# Patient Record
Sex: Male | Born: 1935 | Race: White | Hispanic: No
Health system: Southern US, Community
[De-identification: ages and names within clinical notes are randomized; demographics above are authoritative.]

---

## 1997-06-09 ENCOUNTER — Other Ambulatory Visit: Admission: RE | Admit: 1997-06-09 | Discharge: 1997-06-09 | Payer: Self-pay | Admitting: Internal Medicine

## 1997-11-08 ENCOUNTER — Inpatient Hospital Stay (HOSPITAL_COMMUNITY): Admission: EM | Admit: 1997-11-08 | Discharge: 1997-11-08 | Payer: Self-pay | Admitting: Emergency Medicine

## 1999-05-22 ENCOUNTER — Inpatient Hospital Stay (HOSPITAL_COMMUNITY): Admission: AD | Admit: 1999-05-22 | Discharge: 1999-05-23 | Payer: Self-pay | Admitting: Cardiovascular Disease

## 2002-03-17 ENCOUNTER — Emergency Department (HOSPITAL_COMMUNITY): Admission: EM | Admit: 2002-03-17 | Discharge: 2002-03-17 | Payer: Self-pay | Admitting: Emergency Medicine

## 2002-03-17 ENCOUNTER — Encounter: Payer: Self-pay | Admitting: Emergency Medicine

## 2002-11-22 ENCOUNTER — Emergency Department (HOSPITAL_COMMUNITY): Admission: EM | Admit: 2002-11-22 | Discharge: 2002-11-22 | Payer: Self-pay | Admitting: Emergency Medicine

## 2004-02-25 ENCOUNTER — Emergency Department (HOSPITAL_COMMUNITY): Admission: EM | Admit: 2004-02-25 | Discharge: 2004-02-25 | Payer: Self-pay

## 2004-02-28 ENCOUNTER — Inpatient Hospital Stay (HOSPITAL_COMMUNITY): Admission: EM | Admit: 2004-02-28 | Discharge: 2004-03-02 | Payer: Self-pay | Admitting: Emergency Medicine

## 2004-03-11 ENCOUNTER — Ambulatory Visit: Payer: Self-pay | Admitting: Internal Medicine

## 2004-04-23 ENCOUNTER — Ambulatory Visit: Payer: Self-pay | Admitting: Internal Medicine

## 2004-05-01 ENCOUNTER — Ambulatory Visit: Payer: Self-pay | Admitting: Internal Medicine

## 2004-05-20 ENCOUNTER — Ambulatory Visit: Payer: Self-pay | Admitting: Internal Medicine

## 2004-07-09 ENCOUNTER — Ambulatory Visit: Payer: Self-pay | Admitting: Internal Medicine

## 2005-01-06 ENCOUNTER — Ambulatory Visit: Payer: Self-pay | Admitting: Internal Medicine

## 2005-02-19 ENCOUNTER — Emergency Department (HOSPITAL_COMMUNITY): Admission: EM | Admit: 2005-02-19 | Discharge: 2005-02-19 | Payer: Self-pay | Admitting: Emergency Medicine

## 2005-02-21 ENCOUNTER — Ambulatory Visit: Payer: Self-pay | Admitting: Internal Medicine

## 2005-02-25 ENCOUNTER — Ambulatory Visit: Payer: Self-pay | Admitting: *Deleted

## 2005-03-06 ENCOUNTER — Ambulatory Visit (HOSPITAL_COMMUNITY): Admission: RE | Admit: 2005-03-06 | Discharge: 2005-03-06 | Payer: Self-pay | Admitting: Internal Medicine

## 2005-03-24 ENCOUNTER — Ambulatory Visit: Payer: Self-pay | Admitting: Internal Medicine

## 2005-06-23 ENCOUNTER — Ambulatory Visit: Payer: Self-pay | Admitting: Internal Medicine

## 2005-06-24 ENCOUNTER — Ambulatory Visit: Payer: Self-pay | Admitting: Cardiology

## 2005-08-09 ENCOUNTER — Emergency Department (HOSPITAL_COMMUNITY): Admission: EM | Admit: 2005-08-09 | Discharge: 2005-08-09 | Payer: Self-pay | Admitting: Emergency Medicine

## 2005-10-28 ENCOUNTER — Ambulatory Visit: Payer: Self-pay | Admitting: Internal Medicine

## 2005-12-09 ENCOUNTER — Ambulatory Visit: Payer: Self-pay | Admitting: Internal Medicine

## 2005-12-29 ENCOUNTER — Ambulatory Visit: Payer: Self-pay | Admitting: Internal Medicine

## 2006-02-02 ENCOUNTER — Ambulatory Visit: Payer: Self-pay | Admitting: Cardiology

## 2006-02-26 ENCOUNTER — Inpatient Hospital Stay (HOSPITAL_COMMUNITY): Admission: EM | Admit: 2006-02-26 | Discharge: 2006-03-01 | Payer: Self-pay | Admitting: Emergency Medicine

## 2006-03-12 ENCOUNTER — Ambulatory Visit: Payer: Self-pay | Admitting: Internal Medicine

## 2006-03-13 ENCOUNTER — Emergency Department (HOSPITAL_COMMUNITY): Admission: EM | Admit: 2006-03-13 | Discharge: 2006-03-13 | Payer: Self-pay | Admitting: Emergency Medicine

## 2006-03-24 ENCOUNTER — Emergency Department (HOSPITAL_COMMUNITY): Admission: EM | Admit: 2006-03-24 | Discharge: 2006-03-24 | Payer: Self-pay | Admitting: Emergency Medicine

## 2006-03-30 ENCOUNTER — Ambulatory Visit: Payer: Self-pay | Admitting: Internal Medicine

## 2006-05-07 ENCOUNTER — Ambulatory Visit: Payer: Self-pay | Admitting: Internal Medicine

## 2006-08-05 ENCOUNTER — Ambulatory Visit: Payer: Self-pay | Admitting: Internal Medicine

## 2006-08-28 ENCOUNTER — Emergency Department (HOSPITAL_COMMUNITY): Admission: EM | Admit: 2006-08-28 | Discharge: 2006-08-28 | Payer: Self-pay | Admitting: Emergency Medicine

## 2006-12-09 DIAGNOSIS — J4489 Other specified chronic obstructive pulmonary disease: Secondary | ICD-10-CM | POA: Insufficient documentation

## 2006-12-09 DIAGNOSIS — J628 Pneumoconiosis due to other dust containing silica: Secondary | ICD-10-CM | POA: Insufficient documentation

## 2006-12-09 DIAGNOSIS — J449 Chronic obstructive pulmonary disease, unspecified: Secondary | ICD-10-CM

## 2006-12-10 ENCOUNTER — Ambulatory Visit: Payer: Self-pay | Admitting: Internal Medicine

## 2006-12-14 ENCOUNTER — Ambulatory Visit: Payer: Self-pay | Admitting: Cardiology

## 2007-04-09 ENCOUNTER — Ambulatory Visit: Payer: Self-pay | Admitting: Internal Medicine

## 2007-09-01 ENCOUNTER — Encounter: Payer: Self-pay | Admitting: Internal Medicine

## 2007-10-06 ENCOUNTER — Ambulatory Visit: Payer: Self-pay | Admitting: Internal Medicine

## 2007-10-06 LAB — CONVERTED CEMR LAB
Eosinophils Absolute: 0.2 10*3/uL (ref 0.0–0.7)
MCV: 96.2 fL (ref 78.0–100.0)
Monocytes Absolute: 1.6 10*3/uL — ABNORMAL HIGH (ref 0.1–1.0)
Neutrophils Relative %: 61.6 % (ref 43.0–77.0)
Platelets: 429 10*3/uL — ABNORMAL HIGH (ref 150–400)
RBC: 4.52 M/uL (ref 4.22–5.81)
RDW: 11.9 % (ref 11.5–14.6)
WBC: 9.1 10*3/uL (ref 4.5–10.5)

## 2008-02-03 ENCOUNTER — Ambulatory Visit: Payer: Self-pay | Admitting: Internal Medicine

## 2008-05-14 ENCOUNTER — Observation Stay (HOSPITAL_COMMUNITY): Admission: EM | Admit: 2008-05-14 | Discharge: 2008-05-15 | Payer: Self-pay | Admitting: Emergency Medicine

## 2008-08-01 ENCOUNTER — Ambulatory Visit: Payer: Self-pay | Admitting: Internal Medicine

## 2008-08-01 DIAGNOSIS — J9610 Chronic respiratory failure, unspecified whether with hypoxia or hypercapnia: Secondary | ICD-10-CM

## 2008-10-20 ENCOUNTER — Telehealth: Payer: Self-pay | Admitting: Internal Medicine

## 2009-01-29 ENCOUNTER — Ambulatory Visit: Payer: Self-pay | Admitting: Internal Medicine

## 2009-02-22 ENCOUNTER — Telehealth: Payer: Self-pay | Admitting: Internal Medicine

## 2009-02-26 ENCOUNTER — Ambulatory Visit: Payer: Self-pay | Admitting: Internal Medicine

## 2009-03-05 LAB — CONVERTED CEMR LAB
BUN: 18 mg/dL (ref 6–23)
Basophils Relative: 1.2 % (ref 0.0–3.0)
CO2: 30 meq/L (ref 19–32)
Chloride: 102 meq/L (ref 96–112)
Creatinine, Ser: 0.8 mg/dL (ref 0.4–1.5)
Eosinophils Relative: 4.2 % (ref 0.0–5.0)
Glucose, Bld: 95 mg/dL (ref 70–99)
HCT: 41.2 % (ref 39.0–52.0)
Lymphs Abs: 1.2 10*3/uL (ref 0.7–4.0)
MCHC: 34 g/dL (ref 30.0–36.0)
MCV: 97.1 fL (ref 78.0–100.0)
Monocytes Relative: 15 % — ABNORMAL HIGH (ref 3.0–12.0)
Neutro Abs: 3.7 10*3/uL (ref 1.4–7.7)
Neutrophils Relative %: 60.8 % (ref 43.0–77.0)
Platelets: 366 10*3/uL (ref 150.0–400.0)
Sodium: 140 meq/L (ref 135–145)

## 2009-04-05 ENCOUNTER — Ambulatory Visit: Payer: Self-pay | Admitting: Internal Medicine

## 2009-07-31 ENCOUNTER — Ambulatory Visit: Payer: Self-pay | Admitting: Internal Medicine

## 2009-12-07 ENCOUNTER — Telehealth (INDEPENDENT_AMBULATORY_CARE_PROVIDER_SITE_OTHER): Payer: Self-pay | Admitting: *Deleted

## 2009-12-07 ENCOUNTER — Ambulatory Visit: Payer: Self-pay | Admitting: Internal Medicine

## 2010-01-01 ENCOUNTER — Telehealth (INDEPENDENT_AMBULATORY_CARE_PROVIDER_SITE_OTHER): Payer: Self-pay | Admitting: *Deleted

## 2010-01-03 ENCOUNTER — Ambulatory Visit: Payer: Self-pay | Admitting: Internal Medicine

## 2010-01-03 ENCOUNTER — Inpatient Hospital Stay (HOSPITAL_COMMUNITY)
Admission: EM | Admit: 2010-01-03 | Discharge: 2010-01-07 | Payer: Self-pay | Source: Home / Self Care | Admitting: Emergency Medicine

## 2010-01-03 ENCOUNTER — Encounter: Payer: Self-pay | Admitting: Internal Medicine

## 2010-01-03 ENCOUNTER — Ambulatory Visit: Payer: Self-pay | Admitting: Cardiothoracic Surgery

## 2010-01-10 ENCOUNTER — Inpatient Hospital Stay (HOSPITAL_COMMUNITY)
Admission: EM | Admit: 2010-01-10 | Discharge: 2010-02-04 | Payer: Self-pay | Source: Home / Self Care | Attending: Cardiothoracic Surgery | Admitting: Cardiothoracic Surgery

## 2010-01-23 ENCOUNTER — Ambulatory Visit: Payer: Self-pay | Admitting: Physical Medicine & Rehabilitation

## 2010-01-29 ENCOUNTER — Encounter: Payer: Self-pay | Admitting: Internal Medicine

## 2010-02-05 ENCOUNTER — Encounter: Payer: Self-pay | Admitting: Internal Medicine

## 2010-02-06 ENCOUNTER — Ambulatory Visit: Payer: Self-pay | Admitting: Cardiothoracic Surgery

## 2010-02-11 ENCOUNTER — Ambulatory Visit: Payer: Self-pay | Admitting: Cardiothoracic Surgery

## 2010-02-19 ENCOUNTER — Encounter: Payer: Self-pay | Admitting: Internal Medicine

## 2010-02-19 ENCOUNTER — Encounter
Admission: RE | Admit: 2010-02-19 | Discharge: 2010-02-19 | Payer: Self-pay | Source: Home / Self Care | Attending: Cardiothoracic Surgery | Admitting: Cardiothoracic Surgery

## 2010-02-27 ENCOUNTER — Encounter: Payer: Self-pay | Admitting: Internal Medicine

## 2010-02-27 ENCOUNTER — Ambulatory Visit
Admission: RE | Admit: 2010-02-27 | Discharge: 2010-02-27 | Payer: Self-pay | Source: Home / Self Care | Attending: Thoracic Surgery | Admitting: Thoracic Surgery

## 2010-02-27 ENCOUNTER — Ambulatory Visit
Admission: RE | Admit: 2010-02-27 | Discharge: 2010-02-27 | Payer: Self-pay | Source: Home / Self Care | Attending: Internal Medicine | Admitting: Internal Medicine

## 2010-02-27 ENCOUNTER — Encounter
Admission: RE | Admit: 2010-02-27 | Discharge: 2010-02-27 | Payer: Self-pay | Source: Home / Self Care | Attending: Thoracic Surgery | Admitting: Thoracic Surgery

## 2010-02-28 ENCOUNTER — Inpatient Hospital Stay (HOSPITAL_COMMUNITY)
Admission: EM | Admit: 2010-02-28 | Discharge: 2010-03-12 | Disposition: A | Discharge status: Hospice | Payer: Self-pay | Source: Home / Self Care | Attending: Internal Medicine | Admitting: Internal Medicine

## 2010-02-28 LAB — DIFFERENTIAL
Basophils Absolute: 0 10*3/uL (ref 0.0–0.1)
Basophils Relative: 0 % (ref 0–1)
Eosinophils Absolute: 0 10*3/uL (ref 0.0–0.7)
Eosinophils Relative: 0 % (ref 0–5)
Lymphocytes Relative: 5 % — ABNORMAL LOW (ref 12–46)
Lymphs Abs: 0.6 10*3/uL — ABNORMAL LOW (ref 0.7–4.0)
Monocytes Absolute: 0.5 10*3/uL (ref 0.1–1.0)
Monocytes Relative: 4 % (ref 3–12)
Neutro Abs: 11.6 10*3/uL — ABNORMAL HIGH (ref 1.7–7.7)
Neutrophils Relative %: 91 % — ABNORMAL HIGH (ref 43–77)
WBC Morphology: INCREASED

## 2010-02-28 LAB — BASIC METABOLIC PANEL
BUN: 41 mg/dL — ABNORMAL HIGH (ref 6–23)
CO2: 30 mEq/L (ref 19–32)
Calcium: 8.4 mg/dL (ref 8.4–10.5)
Chloride: 100 mEq/L (ref 96–112)
Creatinine, Ser: 0.86 mg/dL (ref 0.4–1.5)
GFR calc Af Amer: >60 mL/min (ref >60)
GFR calc non Af Amer: >60 mL/min (ref >60)
Glucose, Bld: 123 mg/dL — ABNORMAL HIGH (ref 70–99)
Potassium: 4.7 mEq/L (ref 3.5–5.1)
Sodium: 138 mEq/L (ref 135–145)

## 2010-02-28 LAB — POCT CARDIAC MARKERS
CKMB, poc: 1 ng/mL (ref 1.0–8.0)
Myoglobin, poc: 104 ng/mL (ref 12–200)
Troponin i, poc: <0.05 ng/mL (ref 0.00–0.09)

## 2010-02-28 LAB — CBC
HCT: 38.3 % — ABNORMAL LOW (ref 39.0–52.0)
Hemoglobin: 12.1 g/dL — ABNORMAL LOW (ref 13.0–17.0)
MCH: 29.7 pg (ref 26.0–34.0)
MCHC: 31.6 g/dL (ref 30.0–36.0)
MCV: 94.1 fL (ref 78.0–100.0)
Platelets: 368 10*3/uL (ref 150–400)
RBC: 4.07 MIL/uL — ABNORMAL LOW (ref 4.22–5.81)
RDW: 13.6 % (ref 11.5–15.5)
WBC: 12.7 10*3/uL — ABNORMAL HIGH (ref 4.0–10.5)

## 2010-02-28 LAB — URINALYSIS, ROUTINE W REFLEX MICROSCOPIC
Bilirubin Urine: NEGATIVE
Hemoglobin, Urine: NEGATIVE
Ketones, ur: NEGATIVE mg/dL
Nitrite: NEGATIVE
Protein, ur: NEGATIVE mg/dL
Specific Gravity, Urine: 1.022 (ref 1.005–1.030)
Urine Glucose, Fasting: NEGATIVE mg/dL
Urobilinogen, UA: 0.2 mg/dL (ref 0.0–1.0)
pH: 5.5 (ref 5.0–8.0)

## 2010-03-01 ENCOUNTER — Encounter (INDEPENDENT_AMBULATORY_CARE_PROVIDER_SITE_OTHER): Payer: Self-pay | Admitting: Internal Medicine

## 2010-03-01 LAB — CK TOTAL AND CKMB (NOT AT ARMC)
CK, MB: 1.1 ng/mL (ref 0.3–4.0)
Relative Index: INVALID (ref 0.0–2.5)
Total CK: 20 U/L (ref 7–232)

## 2010-03-01 LAB — CARDIAC PANEL(CRET KIN+CKTOT+MB+TROPI)
CK, MB: 1.3 ng/mL (ref 0.3–4.0)
CK, MB: 1.3 ng/mL (ref 0.3–4.0)
Relative Index: INVALID (ref 0.0–2.5)
Relative Index: INVALID (ref 0.0–2.5)
Total CK: 18 U/L (ref 7–232)
Total CK: 25 U/L (ref 7–232)
Troponin I: 0.04 ng/mL (ref 0.00–0.06)
Troponin I: 0.04 ng/mL (ref 0.00–0.06)

## 2010-03-01 LAB — TSH: TSH: 1.263 u[IU]/mL (ref 0.350–4.500)

## 2010-03-01 LAB — CBC
HCT: 34.6 % — ABNORMAL LOW (ref 39.0–52.0)
Hemoglobin: 10.9 g/dL — ABNORMAL LOW (ref 13.0–17.0)
MCH: 29.5 pg (ref 26.0–34.0)
MCHC: 31.5 g/dL (ref 30.0–36.0)
MCV: 93.8 fL (ref 78.0–100.0)
Platelets: 353 10*3/uL (ref 150–400)
RBC: 3.69 MIL/uL — ABNORMAL LOW (ref 4.22–5.81)
RDW: 13.7 % (ref 11.5–15.5)
WBC: 9.1 10*3/uL (ref 4.0–10.5)

## 2010-03-01 LAB — COMPREHENSIVE METABOLIC PANEL
ALT: 21 U/L (ref 0–53)
AST: 26 U/L (ref 0–37)
Albumin: 1.9 g/dL — ABNORMAL LOW (ref 3.5–5.2)
Alkaline Phosphatase: 96 U/L (ref 39–117)
BUN: 43 mg/dL — ABNORMAL HIGH (ref 6–23)
CO2: 32 mEq/L (ref 19–32)
Calcium: 8.2 mg/dL — ABNORMAL LOW (ref 8.4–10.5)
Chloride: 97 mEq/L (ref 96–112)
Creatinine, Ser: 0.97 mg/dL (ref 0.4–1.5)
GFR calc Af Amer: >60 mL/min (ref >60)
GFR calc non Af Amer: >60 mL/min (ref >60)
Glucose, Bld: 172 mg/dL — ABNORMAL HIGH (ref 70–99)
Potassium: 3.9 mEq/L (ref 3.5–5.1)
Sodium: 140 mEq/L (ref 135–145)
Total Bilirubin: 0.4 mg/dL (ref 0.3–1.2)
Total Protein: 4.9 g/dL — ABNORMAL LOW (ref 6.0–8.3)

## 2010-03-01 LAB — MAGNESIUM: Magnesium: 2 mg/dL (ref 1.5–2.5)

## 2010-03-01 LAB — TROPONIN I: Troponin I: 0.05 ng/mL (ref 0.00–0.06)

## 2010-03-01 LAB — D-DIMER, QUANTITATIVE: D-Dimer, Quant: 2.69 ug/mL-FEU — ABNORMAL HIGH (ref 0.00–0.48)

## 2010-03-01 LAB — GLUCOSE, CAPILLARY
Glucose-Capillary: 149 mg/dL — ABNORMAL HIGH (ref 70–99)
Glucose-Capillary: 117 mg/dL — ABNORMAL HIGH (ref 70–99)

## 2010-03-01 LAB — BRAIN NATRIURETIC PEPTIDE: Pro B Natriuretic peptide (BNP): 71 pg/mL (ref 0.0–100.0)

## 2010-03-11 ENCOUNTER — Telehealth (INDEPENDENT_AMBULATORY_CARE_PROVIDER_SITE_OTHER): Payer: Self-pay | Admitting: *Deleted

## 2010-03-11 LAB — GLUCOSE, CAPILLARY
Glucose-Capillary: 143 mg/dL — ABNORMAL HIGH (ref 70–99)
Glucose-Capillary: 154 mg/dL — ABNORMAL HIGH (ref 70–99)
Glucose-Capillary: 178 mg/dL — ABNORMAL HIGH (ref 70–99)
Glucose-Capillary: 145 mg/dL — ABNORMAL HIGH (ref 70–99)
Glucose-Capillary: 114 mg/dL — ABNORMAL HIGH (ref 70–99)
Glucose-Capillary: 144 mg/dL — ABNORMAL HIGH (ref 70–99)
Glucose-Capillary: 156 mg/dL — ABNORMAL HIGH (ref 70–99)
Glucose-Capillary: 160 mg/dL — ABNORMAL HIGH (ref 70–99)
Glucose-Capillary: 167 mg/dL — ABNORMAL HIGH (ref 70–99)
Glucose-Capillary: 274 mg/dL — ABNORMAL HIGH (ref 70–99)
Glucose-Capillary: 86 mg/dL (ref 70–99)
Glucose-Capillary: 91 mg/dL (ref 70–99)
Glucose-Capillary: 101 mg/dL — ABNORMAL HIGH (ref 70–99)
Glucose-Capillary: 102 mg/dL — ABNORMAL HIGH (ref 70–99)
Glucose-Capillary: 109 mg/dL — ABNORMAL HIGH (ref 70–99)
Glucose-Capillary: 134 mg/dL — ABNORMAL HIGH (ref 70–99)
Glucose-Capillary: 135 mg/dL — ABNORMAL HIGH (ref 70–99)
Glucose-Capillary: 183 mg/dL — ABNORMAL HIGH (ref 70–99)

## 2010-03-11 LAB — PROTIME-INR
INR: 1.09 (ref 0.00–1.49)
INR: 1.13 (ref 0.00–1.49)
INR: 1.3 (ref 0.00–1.49)
INR: 2 — ABNORMAL HIGH (ref 0.00–1.49)
INR: 2.26 — ABNORMAL HIGH (ref 0.00–1.49)
INR: 2.01 — ABNORMAL HIGH (ref 0.00–1.49)
INR: 2.3 — ABNORMAL HIGH (ref 0.00–1.49)
INR: 2.93 — ABNORMAL HIGH (ref 0.00–1.49)
INR: 3.09 — ABNORMAL HIGH (ref 0.00–1.49)
Prothrombin Time: 14.3 seconds (ref 11.6–15.2)
Prothrombin Time: 14.7 seconds (ref 11.6–15.2)
Prothrombin Time: 16.4 seconds — ABNORMAL HIGH (ref 11.6–15.2)
Prothrombin Time: 22.8 seconds — ABNORMAL HIGH (ref 11.6–15.2)
Prothrombin Time: 25.1 seconds — ABNORMAL HIGH (ref 11.6–15.2)
Prothrombin Time: 22.9 seconds — ABNORMAL HIGH (ref 11.6–15.2)
Prothrombin Time: 25.4 seconds — ABNORMAL HIGH (ref 11.6–15.2)
Prothrombin Time: 30.6 seconds — ABNORMAL HIGH (ref 11.6–15.2)
Prothrombin Time: 31.9 seconds — ABNORMAL HIGH (ref 11.6–15.2)

## 2010-03-11 LAB — COMPREHENSIVE METABOLIC PANEL
ALT: 35 U/L (ref 0–53)
ALT: 36 U/L (ref 0–53)
AST: 22 U/L (ref 0–37)
AST: 24 U/L (ref 0–37)
Albumin: 1.9 g/dL — ABNORMAL LOW (ref 3.5–5.2)
Albumin: 2 g/dL — ABNORMAL LOW (ref 3.5–5.2)
Alkaline Phosphatase: 130 U/L — ABNORMAL HIGH (ref 39–117)
Alkaline Phosphatase: 150 U/L — ABNORMAL HIGH (ref 39–117)
BUN: 18 mg/dL (ref 6–23)
BUN: 27 mg/dL — ABNORMAL HIGH (ref 6–23)
CO2: 41 mEq/L (ref 19–32)
CO2: 42 mEq/L (ref 19–32)
Calcium: 8.2 mg/dL — ABNORMAL LOW (ref 8.4–10.5)
Calcium: 8.6 mg/dL (ref 8.4–10.5)
Chloride: 87 mEq/L — ABNORMAL LOW (ref 96–112)
Chloride: 89 mEq/L — ABNORMAL LOW (ref 96–112)
Creatinine, Ser: 0.59 mg/dL (ref 0.4–1.5)
Creatinine, Ser: 0.68 mg/dL (ref 0.4–1.5)
GFR calc Af Amer: >60 mL/min (ref >60)
GFR calc Af Amer: >60 mL/min (ref >60)
GFR calc non Af Amer: >60 mL/min (ref >60)
GFR calc non Af Amer: >60 mL/min (ref >60)
Glucose, Bld: 91 mg/dL (ref 70–99)
Glucose, Bld: 100 mg/dL — ABNORMAL HIGH (ref 70–99)
Potassium: 2.8 mEq/L — ABNORMAL LOW (ref 3.5–5.1)
Potassium: 4.6 mEq/L (ref 3.5–5.1)
Sodium: 137 mEq/L (ref 135–145)
Sodium: 139 mEq/L (ref 135–145)
Total Bilirubin: 0.6 mg/dL (ref 0.3–1.2)
Total Bilirubin: 0.5 mg/dL (ref 0.3–1.2)
Total Protein: 4.8 g/dL — ABNORMAL LOW (ref 6.0–8.3)
Total Protein: 5.2 g/dL — ABNORMAL LOW (ref 6.0–8.3)

## 2010-03-11 LAB — BASIC METABOLIC PANEL
BUN: 38 mg/dL — ABNORMAL HIGH (ref 6–23)
BUN: 21 mg/dL (ref 6–23)
CO2: 32 mEq/L (ref 19–32)
CO2: 42 mEq/L (ref 19–32)
Calcium: 8 mg/dL — ABNORMAL LOW (ref 8.4–10.5)
Calcium: 8.2 mg/dL — ABNORMAL LOW (ref 8.4–10.5)
Chloride: 95 mEq/L — ABNORMAL LOW (ref 96–112)
Chloride: 94 mEq/L — ABNORMAL LOW (ref 96–112)
Creatinine, Ser: 0.83 mg/dL (ref 0.4–1.5)
Creatinine, Ser: 0.64 mg/dL (ref 0.4–1.5)
GFR calc Af Amer: >60 mL/min (ref >60)
GFR calc Af Amer: >60 mL/min (ref >60)
GFR calc non Af Amer: >60 mL/min (ref >60)
GFR calc non Af Amer: >60 mL/min (ref >60)
Glucose, Bld: 153 mg/dL — ABNORMAL HIGH (ref 70–99)
Glucose, Bld: 82 mg/dL (ref 70–99)
Potassium: 3.5 mEq/L (ref 3.5–5.1)
Potassium: 3.5 mEq/L (ref 3.5–5.1)
Sodium: 137 mEq/L (ref 135–145)
Sodium: 142 mEq/L (ref 135–145)

## 2010-03-11 LAB — CBC
HCT: 34.4 % — ABNORMAL LOW (ref 39.0–52.0)
HCT: 34.4 % — ABNORMAL LOW (ref 39.0–52.0)
HCT: 36.4 % — ABNORMAL LOW (ref 39.0–52.0)
HCT: 39.2 % (ref 39.0–52.0)
Hemoglobin: 11.1 g/dL — ABNORMAL LOW (ref 13.0–17.0)
Hemoglobin: 11 g/dL — ABNORMAL LOW (ref 13.0–17.0)
Hemoglobin: 11.7 g/dL — ABNORMAL LOW (ref 13.0–17.0)
Hemoglobin: 12.3 g/dL — ABNORMAL LOW (ref 13.0–17.0)
MCH: 30 pg (ref 26.0–34.0)
MCH: 30 pg (ref 26.0–34.0)
MCH: 29.8 pg (ref 26.0–34.0)
MCH: 29.4 pg (ref 26.0–34.0)
MCHC: 32.3 g/dL (ref 30.0–36.0)
MCHC: 32 g/dL (ref 30.0–36.0)
MCHC: 32.1 g/dL (ref 30.0–36.0)
MCHC: 31.4 g/dL (ref 30.0–36.0)
MCV: 93 fL (ref 78.0–100.0)
MCV: 93.7 fL (ref 78.0–100.0)
MCV: 92.6 fL (ref 78.0–100.0)
MCV: 93.8 fL (ref 78.0–100.0)
Platelets: 355 10*3/uL (ref 150–400)
Platelets: 360 10*3/uL (ref 150–400)
Platelets: 403 10*3/uL — ABNORMAL HIGH (ref 150–400)
Platelets: 423 10*3/uL — ABNORMAL HIGH (ref 150–400)
RBC: 3.7 MIL/uL — ABNORMAL LOW (ref 4.22–5.81)
RBC: 3.67 MIL/uL — ABNORMAL LOW (ref 4.22–5.81)
RBC: 3.93 MIL/uL — ABNORMAL LOW (ref 4.22–5.81)
RBC: 4.18 MIL/uL — ABNORMAL LOW (ref 4.22–5.81)
RDW: 13.5 % (ref 11.5–15.5)
RDW: 13.2 % (ref 11.5–15.5)
RDW: 13.2 % (ref 11.5–15.5)
RDW: 13.3 % (ref 11.5–15.5)
WBC: 10.1 10*3/uL (ref 4.0–10.5)
WBC: 11.6 10*3/uL — ABNORMAL HIGH (ref 4.0–10.5)
WBC: 11.5 10*3/uL — ABNORMAL HIGH (ref 4.0–10.5)
WBC: 10.8 10*3/uL — ABNORMAL HIGH (ref 4.0–10.5)

## 2010-03-11 LAB — DIFFERENTIAL
Basophils Absolute: 0 10*3/uL (ref 0.0–0.1)
Basophils Absolute: 0 10*3/uL (ref 0.0–0.1)
Basophils Relative: 0 % (ref 0–1)
Basophils Relative: 0 % (ref 0–1)
Eosinophils Absolute: 0 10*3/uL (ref 0.0–0.7)
Eosinophils Absolute: 0 10*3/uL (ref 0.0–0.7)
Eosinophils Relative: 0 % (ref 0–5)
Eosinophils Relative: 0 % (ref 0–5)
Lymphocytes Relative: 7 % — ABNORMAL LOW (ref 12–46)
Lymphocytes Relative: 11 % — ABNORMAL LOW (ref 12–46)
Lymphs Abs: 0.8 10*3/uL (ref 0.7–4.0)
Lymphs Abs: 1.1 10*3/uL (ref 0.7–4.0)
Monocytes Absolute: 1.2 10*3/uL — ABNORMAL HIGH (ref 0.1–1.0)
Monocytes Absolute: 0.9 10*3/uL (ref 0.1–1.0)
Monocytes Relative: 10 % (ref 3–12)
Monocytes Relative: 9 % (ref 3–12)
Neutro Abs: 9.6 10*3/uL — ABNORMAL HIGH (ref 1.7–7.7)
Neutro Abs: 8.7 10*3/uL — ABNORMAL HIGH (ref 1.7–7.7)
Neutrophils Relative %: 83 % — ABNORMAL HIGH (ref 43–77)
Neutrophils Relative %: 81 % — ABNORMAL HIGH (ref 43–77)

## 2010-03-11 LAB — CULTURE, BLOOD (ROUTINE X 2)
Culture  Setup Time: 201201060609
Culture  Setup Time: 201201060610
Culture: NO GROWTH
Culture: NO GROWTH

## 2010-03-11 LAB — APTT
aPTT: 50 seconds — ABNORMAL HIGH (ref 24–37)
aPTT: 52 seconds — ABNORMAL HIGH (ref 24–37)

## 2010-03-11 LAB — PROTEIN, TOTAL: Total Protein: 5.1 g/dL — ABNORMAL LOW (ref 6.0–8.3)

## 2010-03-11 LAB — MAGNESIUM
Magnesium: 1.8 mg/dL (ref 1.5–2.5)
Magnesium: 2 mg/dL (ref 1.5–2.5)

## 2010-03-11 LAB — VANCOMYCIN, TROUGH: Vancomycin Tr: 7.9 ug/mL — ABNORMAL LOW (ref 10.0–20.0)

## 2010-03-13 LAB — CBC
HCT: 34.2 % — ABNORMAL LOW (ref 39.0–52.0)
Hemoglobin: 10.7 g/dL — ABNORMAL LOW (ref 13.0–17.0)
MCH: 29.9 pg (ref 26.0–34.0)
MCHC: 31.3 g/dL (ref 30.0–36.0)
MCV: 95.5 fL (ref 78.0–100.0)
Platelets: 456 10*3/uL — ABNORMAL HIGH (ref 150–400)
RBC: 3.58 MIL/uL — ABNORMAL LOW (ref 4.22–5.81)
RDW: 13.4 % (ref 11.5–15.5)
WBC: 13.1 10*3/uL — ABNORMAL HIGH (ref 4.0–10.5)

## 2010-03-14 ENCOUNTER — Telehealth: Payer: Self-pay | Admitting: Internal Medicine

## 2010-03-14 ENCOUNTER — Encounter: Payer: Self-pay | Admitting: Internal Medicine

## 2010-03-16 ENCOUNTER — Encounter: Payer: Self-pay | Admitting: Cardiothoracic Surgery

## 2010-03-17 ENCOUNTER — Encounter: Payer: Self-pay | Admitting: Cardiothoracic Surgery

## 2010-03-17 NOTE — Consult Note (Addendum)
NAME:  Cody Shea, Cody Shea NO.:  [id number]  MEDICAL RECORD NO.:  [id number]          PATIENT TYPE:  INP  LOCATION:  2504                         FACILITY:  [facility]  PHYSICIAN:  Clinton D. Maple Hudson, MD, FCCP, FACPDATE OF BIRTH:  1929[birth year]-04  DATE OF CONSULTATION:  03/01/2010 DATE OF DISCHARGE:                                CONSULTATION   PROBLEM FOR CONSULTATION:  An 77 year old man with history of severe pulmonary silicosis, COPD, and recent hospitalization for spontaneous pneumothorax, status post chest tube placement in the left upper lobe of endobronchial valves.  He had seen me and his chest surgeon in the company of his family for office visits 2 days prior to this admission. He became tired during the day, more short of breath, and was admitted upon recognition of a new pneumonia.  Cough is intermittently productive, and as of morning, sputum is thick and green.  He had been using a flutter device for pulmonary toilet at home, but I have encouraged to use it more aggressively.  REVIEW OF SYSTEMS:  He notices some increasing shortness of breath while on chronic oxygen at 2 liters per minute.  He has not appreciated a significant change in cough.  The cough has become more productive otherwise.  Denies chest pain, fever, chills, nausea or vomiting, syncope or palpitation, blood, adenopathy or rash, change in bowel or bladder.  He does have persistent peripheral edema without calf pain.  PAST MEDICAL HISTORY: 1. Former tobacco smoker with COPD. 2. Pulmonary silicosis. 3. Hypertension. 4. History of atrial fibrillation. 5. Degenerative arthritis. 6. History of GI bleed secondary to nonsteroidal antiinflammatory     drugs. 7. 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 8. Spontaneous left pneumothorax, November 2011, status post chest     tube placement requiring endobronchial valve stenting #3, left     upper lobe with ongoing discussion with his  chest surgeons as to     when those could be removed. 9. Cervical spine surgery. 10.Cervical spondylosis. 11.Macular degeneration in the right eye.  FAMILY HISTORY:  Parents are deceased.  Several family with heart disease and diabetes.  SOCIAL HISTORY:  Widowed.  No alcohol.  He had smoked for about 18 years, stopping at 39.  VACCINATION STATUS:  He has been up to date with pneumococcal vaccine and had flu vaccine appropriately this winter.  OBJECTIVE:  GENERAL APPEARANCE:  He is alert, recognized me easily, fully oriented, pleasant, and conversational.  He is a slender elderly man having visibly lost weight over the past year. VITAL SIGNS:  Pulse feels regular and looks like sinus rhythm on monitor.  Oxygen saturation 90% on 2 liters nasal prongs, BP 89/44, respirations about 18 by my count. SKIN:  Clear. ADENOPATHY:  None found at the neck, supraclavicular, or axillary areas. NEUROLOGIC:  Grossly intact.  Fully oriented.  Tongue protrudes midline. Speech is clear.  He is able to move all extremities easily. Generalized weakness. HEENT:  He moves his right eye normally and pupil is reactive.  Ears and nose are clear.  Oropharynx is clear. NECK:  There is no stridor or neck  vein distention.  Trachea is midline. I do not hear carotid bruits.  Thyroid is not enlarged. CHEST:  Bilateral mild rhonchi and crackle with poor air flow.  He seems to talk in full sentences without laboring.  Rhonchi may be slightly pronounced in the right base, but otherwise there is no lateralization. HEART:  Heart rhythm seems regular at this moment.  I do not hear murmur, gallop or rub.  ABDOMEN:  Soft, scaphoid, and nontender. EXTREMITIES:  There is 2-3+ pitting edema in both feet.  Negative Homans with no cyanosis or clubbing.  Peripheral pulses seem intact.  RADIOLOGY:  I have reviewed the images myself and agree with recognition of a new right lower lobe pneumonia.  There are three  endobronchial stents evident in the left upper lobe.  IMPRESSION: 1. It is appropriate to manage this pneumonia as hospital acquired,     and I agree with choice of vancomycin, Zosyn, and Cipro pending     availability of cultures.  He is chronically weak and debilitated. 2. I support and recognize his choice of do not resuscitate, no code     blue, no intubation. 3. Treatment of chronic obstructive pulmonary disease with Solu-     Medrol, Xopenex, and Pulmicort is appropriate. 4. He has a very supportive and capable family making plans for him     now to move closer to them in [place] at the middle of this     month and that is a realistic discharge plan.  I had encouraged     them to help get him established initially with a primary     physician, who could then steer them to an appropriate     pulmonologist in the area.  He may need to be brought back to this     area for thoracic surgical followup of his endobronchial stents.     Clinton D. Maple Hudson, MD, [facility], FACP     CDY/MEDQ  D:  03/01/2010  T:  03/01/2010  Job:  [id number]  cc:   Eduard Clos, MD Lucky Cowboy, M.D.  Electronically Signed by Jetty Duhamel MD FCCP FACP on 03/17/2010 06:54:18 PM

## 2010-03-27 DEATH — deceased

## 2010-03-28 NOTE — Progress Notes (Signed)
Summary: need phusician to  write orders for [facility]  Phone Note From Other Clinic   Caller: harriett with [facility] care management Call For: Dr. Maple Hudson Summary of Call: Harriette with Care Management at [facility] called. Mr. Radin family wants him in [place] for his last days, or weeks. She has spoken to Hospice states that he will need a physician to write the orders but the physicians will not accept him unless he is seen and he completely bed ridden and can not go to the doctor. The family stated that Dr. Maple Hudson had told them if there was anything that he could do for the family to let him know. The family wants to know if he would be willing to give oreder for [facility] once he is relocated. Harriett has spoken to Hospice in [facility] and they will be willing to accept the orders from Dr. Maple Hudson. She can be reached at [phone] or page [phone] Initial call taken by: Vedia Coffer,  March 11, 2010 1:12 PM  Follow-up for Phone Call        Spoke with Harriett.  She states that the pt has days or weeks to live- his nephew is paying 2500 $ to have him moved to [place] so that he can die there with his family.  She states that pt needs order from CDY for hospice care through [facility].  They have checked with other doctors and nobody is willing to do this for the pt.  Pls advise if you are willing to be te attending for Hospice in [facility], thanks Follow-up by: Vernie Murders,  March 11, 2010 3:06 PM  Additional Follow-up for Phone Call Additional follow up Details #1::        Per CDY- Yes, until he gets a doc there.  Spoke with Harriett and notified.  Additional Follow-up by: Vernie Murders,  March 11, 2010 3:30 PM

## 2010-03-28 NOTE — Assessment & Plan Note (Signed)
Summary: post hosptial and rov from 01-28-10/kcw   Primary Provider/Referring Provider:  Oneta Rack  CC:  Hospital follow up, pt c/o increase sob, loose prod cough  light yellow, and pt will be moving to [place] to live with family needs referral.  History of Present Illness: February 26, 2009-  He called [place] complaining of increased weakness and easier dyspnea gradually over months, but especialy in past 5- days. Cough thick yellow. No blood, fever, nodes, chest pain, leg pain or ankle edema.  April 05, 2009-Silicosis, COPD, Chronic respiratory failure Maintenance prednisone has helped breathing. He still tires easily, but with prednisone and his maintenance oxygen he can clear phlegm more easily- white- and he can wash and dry dishes without having to stop to rest. Denies palpitation or syncope, chest pain or foot swelling.  July 31, 2009- Silicosis, COPD, Chronic respiratory failure Has continued maintenance prednisone 5 mg daily. It doesn't help his dyspnea with limited exertion, but it definitely helps his appetite. Cough is comfortably productive of clear phlegm with "MucusRelief". Denies wheeze, blood, chest pain or ankle edema. Stays on oxygen 2 L even with exertion.   February 27, 2010- Silicosis, COPD, Chronic respiratory failure Nurse-CC: Hospital follow up, pt c/o increase sob, loose prod cough  light yellow, pt will be moving to [place] to live with family needs referral He was hosp for spontaneous left  PTX with chest tube, endobronchial valve x 3, instillation of talc. . They are going to Dr Edwyna Shell to f/u this morning.  He was slow recovering from anesthesia. Exertion of Physical Therapy at Rehab facility is too exhausting. Cough productive of white  to yellow, nonbloody sputum.. Not much pain at chest tube site, no angina or palpitation. . Feet swell.  He is about to move to [place] with family and we discussed that move and getting re-established.    Preventive  Screening-Counseling & Management  Alcohol-Tobacco     Smoking Status: quit     Packs/Day: 0.5     Year Quit: 1991  Current Medications (verified): 1)  Advair Diskus 250-50 Mcg/dose  Misc (Fluticasone-Salmeterol) .Marland Kitchen.. 1 Puff Bid 2)  Gemfibrozil 600 Mg  Tabs (Gemfibrozil) .... As Directed 3)  Proair Hfa 108 (90 Base) Mcg/act Aers (Albuterol Sulfate) .... 2 Puffs Four Times A Day As Needed 4)  Oxygen 2 L/m [employer] Continuous 5)  Verapamil Hcl Cr 180 Mg Cr-Tabs (Verapamil Hcl) .Marland Kitchen.. 1 Once Daily 6)  Vitamin D3 1000 Unit Caps (Cholecalciferol) .... Take 2 By Mouth Once Daily 7)  Zantac 150 Mg Tabs (Ranitidine Hcl) .... Take 1 By Mouth Once Daily 8)  Prednisone 5 Mg Tabs (Prednisone) .Marland Kitchen.. 1-2 Tabs Daily As Needed 9)  Mucus Relief 400 Mg Tabs (Guaifenesin) .... Take 2 By Mouth Two Times A Day 10)  Magnesium 250 Mg Tabs (Magnesium) .... Take 2 By Mouth Once Daily 11)  Lanoxin 0.125 Mg Tabs (Digoxin) .Marland Kitchen.. 1 Once Daily 12)  Ranitidine Hcl 150 Mg Caps (Ranitidine Hcl) .Marland Kitchen.. 1 Once Daily 13)  Spiriva Handihaler 18 Mcg Caps (Tiotropium Bromide Monohydrate) .Marland Kitchen.. 1 Once Daily 14)  Hhn .... Three Times A Day  Allergies (verified): 1)  ! Aspirin 2)  ! Codeine 3)  Doxycycline  Past History:  Past Surgical History: Last updated: 08/01/2008 traumatic loss left eye Cervical spine surgery- 3-6  Family History: Last updated: 2007/10/21 Mother- deceased age 75 Father-deceased age 31 sibling 1- deceased age 25; heart Sibling 2 - deceased age 44;heart Sibling 3-deceased age 72; heart  Sibling 4- living age 40; DM Sibling 13- living age 38; DM  Social History: Last updated: 10/14/2007 Patient states former smoker. Quit in 1991 SMoked for 18 years widowed; no children Exercies 5 times a week no etoh  Risk Factors: Smoking Status: quit (02/27/2010) Packs/Day: 0.5 (02/27/2010)  Past Medical History: Silicosis COPD  Hx Pneumonia Cervical spondylosis Macular degeneration right eye /  prosthesis left eye (traumatic loss) hx NSAID induced GI bleed  Social History: Smoking Status:  quit Packs/Day:  0.5  Review of Systems      See HPI       The patient complains of shortness of breath with activity, productive cough, weight change, and hand/feet swelling.  The patient denies shortness of breath at rest, non-productive cough, coughing up blood, chest pain, irregular heartbeats, acid heartburn, indigestion, loss of appetite, abdominal pain, difficulty swallowing, sore throat, tooth/dental problems, headaches, nasal congestion/difficulty breathing through nose, sneezing, itching, ear ache, anxiety, depression, rash, change in color of mucus, and fever.    Vital Signs:  Patient profile:   77 year old male Height:      68 inches Weight:      99.8 pounds BMI:     15.23 O2 Sat:      92 % on 2 L/min Pulse rate:   70 / minute BP sitting:   140 / 78  (left arm)  Vitals Entered By: Renold Genta RCP, LPN (February 27, 2010 11:29 AM)  O2 Flow:  2 L/min CC: Hospital follow up, pt c/o increase sob, loose prod cough  light yellow, pt will be moving to [place] to live with family needs referral Comments Medications reviewed with patient Renold Genta RCP, LPN  February 27, 2010 11:37 AM    Physical Exam  Additional Exam:  General: A/Ox3; pleasant and cooperative, NAD, thin, supplemental oxygen- 92% sat on 2L/M SKIN: no rash, lesions NODES: no lymphadenopathy HEENT: [place]/AT, EOM- WNL, Conjuctivae- clear, PERRLA, TM-WNL, Nose- clear, Throat- clear and wnl, raspy, some whistle at larynx, no stridor NECK: Supple w/ fair ROM, JVD- none, normal carotid impulses w/o bruits Thyroid-  CHEST: Bilateral wheeze and rhonchi., poor airflow, talkative HEART: RRR, no m/g/r heard ABDOMEN: Soft scaphoid, nontender YQM:VHQI, nl pulses, 2-3 + edema, no clubbing  NEURO: Grossly intact to observation      Impression & Recommendations:  Problem # 1:  CHRONIC RESPIRATORY FAILURE  (ICD-518.83)  Significant chronic respiratory limitation, with peripheral edema likely due to cor pulmonale and a residual bronchitis with secretions. I don't think another antibiotic would help now. He has a Flutter and is encouraged to start using it for pulmonary toilet. We discussed establishement first with a primary doctor in [place].  He is chronically oxygen dependent and looking very frail since his last hosp.   Problem # 2:  SILICOSIS (ICD-502) Severe fibrosis with no specific treatment available.   Medications Added to Medication List This Visit: 1)  Verapamil Hcl Cr 180 Mg Cr-tabs (Verapamil hcl) .Marland Kitchen.. 1 once daily 2)  Lanoxin 0.125 Mg Tabs (Digoxin) .Marland Kitchen.. 1 once daily 3)  Ranitidine Hcl 150 Mg Caps (Ranitidine hcl) .Marland Kitchen.. 1 once daily 4)  Spiriva Handihaler 18 Mcg Caps (Tiotropium bromide monohydrate) .Marland Kitchen.. 1 once daily 5)  Hhn  .... Three times a day  Other Orders: Est. Patient Level IV (69629)  Patient Instructions: 1)  I wish you well with your move. Please call as needed.  2)  You can run your oxygen 2-3 L/M  3)  use your  Flutter, blowing 3-4 times, 3 times daily to loosen the mucus in your chest.  4)  cc Dr Oneta Rack

## 2010-03-28 NOTE — Assessment & Plan Note (Signed)
Summary: discuss fatigue/lwa   Primary Provider/Referring Provider:  Oneta Rack  CC:  Accute Visit-Increased fatigue x 5-6 days; SOB and cough-thick and yellow in color..  History of Present Illness: 08/01/08- Silicosis, COPD Using Guaifenesin 6 daily and it increases ability to clear secretions. Nebulizer machine worn out and not using it. Lost weight eating regularly, now regaining a little.- attributed to work of breathing. "I can't slow down". Dr Oneta Rack watching closely. Denies chest pain, palpitation. Coughs little and scant clear thick mucus. No pedal edema, fevers, sweats or enlarged glands. Using continuous oxygen since October. ER visit for dyspnea in March with cough- reesponded to sequential nebs. He uses rescue inhaler 0-6 / day.  January 29, 2009-Silicosis, COPD Lugging around his large portable oxygen tank. Continues slow gradual weight loss. Eats well and denies bleeding. Dr Oneta Rack attributes it to work of breathing. Lives alone. Now treated for macular degeneration in his one eye. Breathing isn't dramatically changed, but slowly less exercise tolerance. Cough unchanged, productive white mucus several times daily. Had flu vax. Has had Pneumovax at least twice. Denies chest pain. Chronic intermittent palpitation, no edema.  February 26, 2009-  He called [place] complaining of increased weakness and easier dyspnea gradually over months, but especialy in past 5- days. Cough thick yellow. No blood, fever, nodes, chest pain, leg pain or ankle edema.   Current Medications (verified): 1)  Advair Diskus 250-50 Mcg/dose  Misc (Fluticasone-Salmeterol) .Marland Kitchen.. 1 Puff Bid 2)  Gemfibrozil 600 Mg  Tabs (Gemfibrozil) .... As Directed 3)  Proair Hfa 108 (90 Base) Mcg/act Aers (Albuterol Sulfate) .... 2 Puffs Four Times A Day As Needed 4)  Oxygen 2 L/m [facility] Continuous 5)  Verapamil Hcl Cr 120 Mg Cr-Tabs (Verapamil Hcl) .... Take 1 By Mouth Two Times A Day 6)  Vitamin D3 1000 Unit Caps  (Cholecalciferol) .... Take 1 By Mouth Once Daily 7)  Zantac 150 Mg Tabs (Ranitidine Hcl) .... Take 1 By Mouth Once Daily  Allergies (verified): 1)  ! Aspirin 2)  ! Codeine 3)  Doxycycline  Past History:  Past Medical History: Last updated: 04/09/2007 Silicosis COPD Pneumonia Cervical spondylosis  Past Surgical History: Last updated: 08/01/2008 traumatic loss left eye Cervical spine surgery- 3-6  Family History: Last updated: 2007-10-16 Mother- deceased age 52 Father-deceased age 88 sibling 1- deceased age 66; heart Sibling 2 - deceased age 1;heart Sibling 3-deceased age 48; heart Sibling 31- living age 73; DM Sibling 40- living age 92; DM  Social History: Last updated: 10/14/2007 Patient states former smoker. Quit in 1991 SMoked for 18 years widowed; no children Exercies 5 times a week no etoh  Risk Factors: Smoking Status: quit (04/09/2007)  Review of Systems      See HPI       The patient complains of weight loss and dyspnea on exertion.  The patient denies anorexia, fever, weight gain, vision loss, decreased hearing, hoarseness, chest pain, syncope, peripheral edema, prolonged cough, headaches, hemoptysis, abdominal pain, and severe indigestion/heartburn.    Vital Signs:  Patient profile:   77 year old male Height:      68 inches Weight:      103.13 pounds BMI:     15.74 O2 Sat:      98 % on 1.5 L/min Pulse rate:   98 / minute BP sitting:   122 / 68  (left arm) Cuff size:   regular  Vitals Entered By: Reynaldo Minium CMA (February 26, 2009 10:41 AM)  O2 Flow:  1.5  L/min  Physical Exam  Additional Exam:  General: A/Ox3; pleasant and cooperative, NAD, thin, supplemental oxygen- hands too cold to register for oximetry SKIN: no rash, lesions NODES: no lymphadenopathy HEENT: /AT, EOM- WNL, Conjuctivae- clear, PERRLA, TM-WNL, Nose- clear, Throat- clear and wnl NECK: Supple w/ fair ROM, JVD- none, normal carotid impulses w/o bruits Thyroid-  CHEST:  Distant but no crackles, rales, wheezes or dullness., talkative HEART: RRR, no m/g/r heard ABDOMEN: Soft scaphoid, nontender ZOX:WRUE, nl pulses, no edema, no clubbing  NEURO: Grossly intact to observation      Impression & Recommendations:  Problem # 1:  SILICOSIS (ICD-502) He has been declining, but I''m not sure if there is more than expected from his silicosis. Remains on his oxygen. We will check for anemia and hypokalemia  Medications Added to Medication List This Visit: 1)  Prednisone 10 Mg Tabs (Prednisone) .... Take 1 by mouth once daily  Other Orders: Est. Patient Level II (45409) TLB-BMP (Basic Metabolic Panel-BMET) (80048-METABOL) TLB-CBC Platelet - w/Differential (85025-CBCD)  Patient Instructions: 1)  Please schedule a follow-up appointment in 1 month. 2)  Script for prednisone 10 mg daily to  3)  Labs Prescriptions: PREDNISONE 10 MG TABS (PREDNISONE) take 1 by mouth once daily  #30 x 1   Entered by:   Reynaldo Minium CMA   Authorized by:   Waymon Budge MD   Signed by:   Reynaldo Minium CMA on 02/26/2009   Method used:   Telephoned to ...       [facility]  [address] # [id number]* (retail)       [address]       [place], [place]  [address]       Ph: [phone] or [phone]       Fax: [phone]   RxID:   [id number]

## 2010-03-28 NOTE — Progress Notes (Signed)
Summary: PRESCRIPT  Phone Note Call from Patient   Caller: Patient Call For: YOUNG Summary of Call: PT NEED REFILL FOR ADVAIR 250/50 SENT TO [facility] Initial call taken by: Rickard Patience,  January 01, 2010 9:53 AM  Follow-up for Phone Call        Pt states that [facility]. [facility] and give refill verbal over the phone, but I could not get to a person, so I have printed rx to have CY sign and I will fax once signed.Carron Curie CMA  January 01, 2010 10:39 AM   Additional Follow-up for Phone Call Additional follow up Details #1::        Pt aware that [facility] at [facility] request.Katie [facility] CMA  January 01, 2010 12:28 PM     Prescriptions: ADVAIR DISKUS 250-50 MCG/DOSE  MISC (FLUTICASONE-SALMETEROL) 1 puff bid  #3 x 1   Entered by:   Carron Curie CMA   Authorized by:   Waymon Budge MD   Signed by:   Carron Curie CMA on 01/01/2010   Method used:   Printed then faxed to ...       [facility]* (mail-order)             , [place]         Ph: [phone]       Fax: [phone]   RxID:   [id number]

## 2010-03-28 NOTE — Assessment & Plan Note (Signed)
Summary: flu shot//jrc  Nurse Visit   Allergies: 1)  ! Aspirin 2)  ! Codeine 3)  Doxycycline  Orders Added: 1)  Flu Vaccine 81yrs + MEDICARE PATIENTS [Q2039] 2)  Administration Flu vaccine - MCR [G0008] Flu Vaccine Consent Questions     Do you have a history of severe allergic reactions to this vaccine? no    Any prior history of allergic reactions to egg and/or gelatin? no    Do you have a sensitivity to the preservative Thimersol? no    Do you have a past history of Guillan-Barre Syndrome? no    Do you currently have an acute febrile illness? no    Have you ever had a severe reaction to latex? no    Vaccine information given and explained to patient? yes    Are you currently pregnant? no    Lot Number:[id number]   Exp Date:08/24/2010   Site Given  Left Deltoid IMmedflu   Tammy Scott  December 10, 2009 9:57 AM

## 2010-03-28 NOTE — Assessment & Plan Note (Signed)
Summary: 6 month follow up/kcw   Primary Provider/Referring Provider:  Oneta Rack  CC:  6 month follow up visit-breathing okay as long as not to hot out.Marland Kitchen  History of Present Illness:  January 29, 2009-Silicosis, COPD Lugging around his large portable oxygen tank. Continues slow gradual weight loss. Eats well and denies bleeding. Dr Oneta Rack attributes it to work of breathing. Lives alone. Now treated for macular degeneration in his one eye. Breathing isn't dramatically changed, but slowly less exercise tolerance. Cough unchanged, productive white mucus several times daily. Had flu vax. Has had Pneumovax at least twice. Denies chest pain. Chronic intermittent palpitation, no edema.  February 26, 2009-  He called [place] complaining of increased weakness and easier dyspnea gradually over months, but especialy in past 5- days. Cough thick yellow. No blood, fever, nodes, chest pain, leg pain or ankle edema.  April 05, 2009-Silicosis, COPD, Chronic respiratory failure Maintenance prednisone has helped breathing. He still tires easily, but with prednisone and his maintenance oxygen he can clear phlegm more easily- white- and he can wash and dry dishes without having to stop to rest. Denies palpitation or syncope, chest pain or foot swelling.  July 31, 2009- Silicosis, COPD, Chronic respiratory failure Has continued maintenance prednisone 5 mg daily. It doesn't help his dyspnea with limited exertion, but it definitely helps his appetite. Cough is comfortably productive of clear phlegm with "MucusRelief". Denies wheeze, blood, chest pain or ankle edema. Stays on oxygen 2 L even with exertion.      Preventive Screening-Counseling & Management  Alcohol-Tobacco     Smoking Status: quit > 6 months     Year Started: 1948     Year Quit: 1991  Current Medications (verified): 1)  Advair Diskus 250-50 Mcg/dose  Misc (Fluticasone-Salmeterol) .Marland Kitchen.. 1 Puff Bid 2)  Gemfibrozil 600 Mg  Tabs (Gemfibrozil) ....  As Directed 3)  Proair Hfa 108 (90 Base) Mcg/act Aers (Albuterol Sulfate) .... 2 Puffs Four Times A Day As Needed 4)  Oxygen 2 L/m [employer] Continuous 5)  Verapamil Hcl Cr 120 Mg Cr-Tabs (Verapamil Hcl) .... Take 1 By Mouth Two Times A Day 6)  Vitamin D3 1000 Unit Caps (Cholecalciferol) .... Take 2 By Mouth Once Daily 7)  Zantac 150 Mg Tabs (Ranitidine Hcl) .... Take 1 By Mouth Once Daily 8)  Prednisone 5 Mg Tabs (Prednisone) .Marland Kitchen.. 1-2 Tabs Daily As Needed 9)  Mucus Relief 400 Mg Tabs (Guaifenesin) .... Take 2 By Mouth Two Times A Day 10)  Magnesium 250 Mg Tabs (Magnesium) .... Take 2 By Mouth Once Daily  Allergies (verified): 1)  ! Aspirin 2)  ! Codeine 3)  Doxycycline  Past History:  Past Surgical History: Last updated: 08/01/2008 traumatic loss left eye Cervical spine surgery- 3-6  Family History: Last updated: 2007/10/23 Mother- deceased age 61 Father-deceased age 100 sibling 1- deceased age 31; heart Sibling 2 - deceased age 22;heart Sibling 3-deceased age 94; heart Sibling 90- living age 7; DM Sibling 35- living age 58; DM  Social History: Last updated: 10/14/2007 Patient states former smoker. Quit in 1991 SMoked for 18 years widowed; no children Exercies 5 times a week no etoh  Risk Factors: Smoking Status: quit > 6 months (07/31/2009)  Past Medical History: Silicosis COPD Pneumonia Cervical spondylosis Macular degeneration right eye / prosthesis left eye  Social History: Smoking Status:  quit > 6 months  Review of Systems      See HPI       The patient complains of  shortness of breath with activity and productive cough.  The patient denies shortness of breath at rest, non-productive cough, coughing up blood, chest pain, irregular heartbeats, acid heartburn, indigestion, loss of appetite, weight change, abdominal pain, difficulty swallowing, sore throat, tooth/dental problems, headaches, nasal congestion/difficulty breathing through nose, sneezing, itching,  hand/feet swelling, rash, and change in color of mucus.    Vital Signs:  Patient profile:   77 year old male Height:      68 inches Weight:      103 pounds BMI:     15.72 O2 Sat:      100 % on 2 L/min Pulse rate:   72 / minute BP sitting:   118 / 70  (left arm) Cuff size:   regular  Vitals Entered By: Reynaldo Minium CMA (July 31, 2009 9:09 AM)  O2 Flow:  2 L/min CC: 6 month follow up visit-breathing okay as long as not to hot out.   Physical Exam  Additional Exam:  General: A/Ox3; pleasant and cooperative, NAD, thin, supplemental oxygen- 100% sat on 2L/M SKIN: no rash, lesions NODES: no lymphadenopathy HEENT: [place]/AT, EOM- WNL, Conjuctivae- clear, PERRLA, TM-WNL, Nose- clear, Throat- clear and wnl, raspy NECK: Supple w/ fair ROM, JVD- none, normal carotid impulses w/o bruits Thyroid-  CHEST: Distant with trace bilateral respiratory wheeze only heard anteriorly., talkative HEART: RRR, no m/g/r heard ABDOMEN: Soft scaphoid, nontender EAV:WUJW, nl pulses, no edema, no clubbing  NEURO: Grossly intact to observation      Impression & Recommendations:  Problem # 1:  CHRONIC RESPIRATORY FAILURE (JXB-147.82)  I reviewed his oxygen status. He prefers to carry multiple tanks rather than deal with limited duration liquid oxygen. I gave permission to increase to 3-4 L/M when active as needed.  Problem # 2:  SILICOSIS (ICD-502)  No progression but we continue to track. He had some problems with Workman's Comp status from Radiology.  Problem # 3:  COPD (ICD-496) He had smoked for a long time. Fibrosis with restrictive component is actually ikely to stabilize his airways against effects of emphysema.  Medications Added to Medication List This Visit: 1)  Vitamin D3 1000 Unit Caps (Cholecalciferol) .... Take 2 by mouth once daily 2)  Mucus Relief 400 Mg Tabs (Guaifenesin) .... Take 2 by mouth two times a day 3)  Magnesium 250 Mg Tabs (Magnesium) .... Take 2 by mouth once  daily  Other Orders: Est. Patient Level IV (95621)  Patient Instructions: 1)  Please schedule a follow-up appointment in 6 months. 2)  OK to run oxygen at 3-4 :L/M when active, turning back down to 2L/M at rest and for sleep 3)  Continue prednisone 5-10 mg daily.

## 2010-03-28 NOTE — Progress Notes (Signed)
Summary: FYI  Phone Note Other Incoming   Caller: sarah--hospice--[phone] Summary of Call: FYI:  Patient passed away this morning at 1:45am. Initial call taken by: Lehman Prom,  03-24-10 8:13 AM  Follow-up for Phone Call        noted by CDY per Katie.Reynaldo Minium CMA  March 24, 2010 9:53 AM

## 2010-03-28 NOTE — Letter (Signed)
Summary: [facility]  [facility]   Imported By: Sherian Rein 03/06/2010 13:02:59  _____________________________________________________________________  External Attachment:    Type:   Image     Comment:   External Document

## 2010-03-28 NOTE — Progress Notes (Signed)
Summary: refill  Phone Note Call from Patient Call back at Home Phone [phone]   Caller: Patient Call For: young Reason for Call: Refill Medication Summary of Call: Need refill on advair 250-54mcg [facility] Initial call taken by: Darletta Moll,  December 07, 2009 9:10 AM  Follow-up for Phone Call        Spoke with pt and advised that [facility]. Follow-up by: Vernie Murders,  December 07, 2009 10:00 AM    Prescriptions: ADVAIR DISKUS 250-50 MCG/DOSE  MISC (FLUTICASONE-SALMETEROL) 1 puff bid  #3 x 1   Entered by:   Vernie Murders   Authorized by:   Waymon Budge MD   Signed by:   Vernie Murders on 12/07/2009   Method used:   Electronically to        [facility] (mail-[facility])             , [place]         Ph: [phone]       Fax: [phone]   RxID:   [id number]

## 2010-03-28 NOTE — Assessment & Plan Note (Signed)
Summary: 1 month/ mbw   Primary Provider/Referring Provider:  Oneta Rack  CC:  1 month follow up visit-breathing better but decrease energy.Cody Shea  History of Present Illness:  January 29, 2009-Silicosis, COPD Lugging around his large portable oxygen tank. Continues slow gradual weight loss. Eats well and denies bleeding. Dr Oneta Rack attributes it to work of breathing. Lives alone. Now treated for macular degeneration in his one eye. Breathing isn't dramatically changed, but slowly less exercise tolerance. Cough unchanged, productive white mucus several times daily. Had flu vax. Has had Pneumovax at least twice. Denies chest pain. Chronic intermittent palpitation, no edema.  February 26, 2009-  He called [place] complaining of increased weakness and easier dyspnea gradually over months, but especialy in past 5- days. Cough thick yellow. No blood, fever, nodes, chest pain, leg pain or ankle edema.  April 05, 2009-Silicosis, COPD, Chronic respiratory failure Maintenance prednisone has helped breathing. He still tires easily, but with prednisone and his maintenance oxygen he can clear phlegm more easily- white- and he can wash and dry dishes without having to stop to rest. Denies palpitation or syncope, chest pain or foot swelling.   Current Medications (verified): 1)  Advair Diskus 250-50 Mcg/dose  Misc (Fluticasone-Salmeterol) .Cody Shea.. 1 Puff Bid 2)  Gemfibrozil 600 Mg  Tabs (Gemfibrozil) .... As Directed 3)  Proair Hfa 108 (90 Base) Mcg/act Aers (Albuterol Sulfate) .... 2 Puffs Four Times A Day As Needed 4)  Oxygen 2 L/m [facility] Continuous 5)  Verapamil Hcl Cr 120 Mg Cr-Tabs (Verapamil Hcl) .... Take 1 By Mouth Two Times A Day 6)  Vitamin D3 1000 Unit Caps (Cholecalciferol) .... Take 1 By Mouth Once Daily 7)  Zantac 150 Mg Tabs (Ranitidine Hcl) .... Take 1 By Mouth Once Daily 8)  Prednisone 10 Mg Tabs (Prednisone) .... Take 1 By Mouth Once Daily  Allergies (verified): 1)  ! Aspirin 2)  ! Codeine 3)   Doxycycline  Past History:  Past Medical History: Last updated: 04/09/2007 Silicosis COPD Pneumonia Cervical spondylosis  Past Surgical History: Last updated: 08/01/2008 traumatic loss left eye Cervical spine surgery- 3-6  Family History: Last updated: 10/26/07 Mother- deceased age 10 Father-deceased age 33 sibling 1- deceased age 27; heart Sibling 2 - deceased age 37;heart Sibling 3-deceased age 66; heart Sibling 38- living age 74; DM Sibling 67- living age 37; DM  Social History: Last updated: 10/14/2007 Patient states former smoker. Quit in 1991 SMoked for 18 years widowed; no children Exercies 5 times a week no etoh  Risk Factors: Smoking Status: quit (04/09/2007)  Review of Systems      See HPI       The patient complains of dyspnea on exertion and prolonged cough.  The patient denies anorexia, fever, weight loss, weight gain, vision loss, decreased hearing, hoarseness, chest pain, syncope, headaches, hemoptysis, abdominal pain, and severe indigestion/heartburn.    Vital Signs:  Patient profile:   77 year old male Height:      68 inches Weight:      106 pounds BMI:     16.18 O2 Sat:      99 % on 2 L/min Pulse rate:   100 / minute BP sitting:   126 / 80  (left arm) Cuff size:   regular  Vitals Entered By: Reynaldo Minium CMA (April 05, 2009 10:37 AM)  O2 Flow:  2 L/min  Physical Exam  Additional Exam:  General: A/Ox3; pleasant and cooperative, NAD, thin, supplemental oxygen- hands too cold to register for oximetry SKIN:  no rash, lesions NODES: no lymphadenopathy HEENT: [place]/AT, EOM- WNL, Conjuctivae- clear, PERRLA, TM-WNL, Nose- clear, Throat- clear and wnl, raspy NECK: Supple w/ fair ROM, JVD- none, normal carotid impulses w/o bruits Thyroid-  CHEST: Distant with trace bilateral espiratory wheeze only heard anteriorly., talkative HEART: Frequent dropped beats, no m/g/r heard ABDOMEN: Soft scaphoid, nontender ZOX:WRUE, nl pulses, no edema, no  clubbing  NEURO: Grossly intact to observation      Impression & Recommendations:  Problem # 1:  CHRONIC RESPIRATORY FAILURE (ICD-518.83)  We will change to 5 mg prednisone tabs for dosing flexibility and let him see if he can get down to 5 mg daily.  Orders: Est. Patient Level III (45409) Prescription Created Electronically [phone])  Problem # 2:  COPD (ICD-496)  He looked at different protable oxygen systems and decided to stay with tanks for greater duration.  Orders: Est. Patient Level III (47829)  Medications Added to Medication List This Visit: 1)  Prednisone 5 Mg Tabs (Prednisone) .Cody Shea.. 1-2 tabs daily as needed  Patient Instructions: 1)  Keep appointmnet in June unless needed earlier 2)  We are changing prednisone pills to 5 mg, so you can try 1-2 tabs per day as needed. Prescriptions: PREDNISONE 5 MG TABS (PREDNISONE) 1-2 tabs daily as needed  #150 x 0   Entered and Authorized by:   Waymon Budge MD   Signed by:   Waymon Budge MD on 04/05/2009   Method used:   Electronically to        [id number] (retail)       [address]       [place], [place]  [address]       Ph: [phone] or [phone]       Fax: [phone]   RxID:   [id number]    Immunization History:  Influenza Immunization History:    Influenza:  historical (12/13/2008)

## 2010-03-28 NOTE — Miscellaneous (Signed)
Summary: [facility]  [facility]   Imported By: Sherian Rein 03/07/2010 08:01:03  _____________________________________________________________________  External Attachment:    Type:   Image     Comment:   External Document

## 2010-03-28 NOTE — Letter (Signed)
Summary: Kerin Perna MD/[place]  Kathlee Nations Trigt MD/[place]   Imported By: Lester Speed 02/01/2010 10:00:31  _____________________________________________________________________  External Attachment:    Type:   Image     Comment:   External Document

## 2010-03-28 NOTE — Letter (Signed)
Summary: [place]  [place]   Imported By: Sherian Rein 02/27/2010 12:19:23  _____________________________________________________________________  External Attachment:    Type:   Image     Comment:   External Document

## 2010-04-03 NOTE — Letter (Signed)
Summary: Death Certificate/[facility]  Death Certificate/[facility]   Imported By: Maryln Gottron 03/25/2010 09:29:36  _____________________________________________________________________  External Attachment:    Type:   Image     Comment:   External Document

## 2010-04-15 NOTE — Consult Note (Signed)
NAME:  Cody Shea, Cody Shea NO.:  [id number]  MEDICAL RECORD NO.:  [id number]          PATIENT TYPE:  INP  LOCATION:  2504                         FACILITY:  [facility]  PHYSICIAN:  Ether Wolters L. Ladona Ridgel, MD  DATE OF BIRTH:  1927-04-30  DATE OF CONSULTATION:  03/08/2010 DATE OF DISCHARGE:                                CONSULTATION   REQUESTING PHYSICIAN:  Mauro Kaufmann, MD  REASON FOR CONSULTATION:  Goals of care.  This nurse practitioner, Dorian Pod received report from the team, spoke with nursing staff, reviewed medical records, examined the patient, then met with the patient.  His friend, Cody Shea [phone] and his nephew, Cody Shea via phone at [phone] to discuss goals, options, and end-of-life issues.  A detailed discussion was had regarding advanced directives with concepts specific to the difference between an aggressive medical interventions path versus a palliative comfort care path for this patient at this time in his current situation.  Time in 11:15, time out 12:15 with a 60-minute consultation.  Greater than 50% of this time was spent in medical counseling, coordination of care, and discussion of the pathophysiology of the patient's disease process.  His problem list at this time is as follows: 1. Failure to thrive. 2. Refractory dyspnea. 3. A palliative performance score of 20%. 4. Profound electrolyte imbalances. 5. Protein of 1.9. 6. Leukocytosis in the setting of active infection and steroid use. 7. Cardiac arrhythmias, both atrial and ventricular. 8. Profound deconditioning and weakness. 9. Cachexia with a weight of 92 pounds and a BMI of 14.  The patient's goals of care at this time are confirmed by the patient who is oriented and alert enough to make his decisions and his nephew, Cody Shea. 1. Confirm DNR/DNI status. 2. Continue antibiotics, IV fluids, and medications. 3. The patient's wish is to be transported to [place] to be near     his  family.  The patient most likely will not be able to make this trip.  Nephew, Cody Shea will be here tomorrow to help decide.  He has not seen the patient in a week.  The patient has deteriorated quickly over the last week according to nurse.  The patient is very unstable.  He has unrealistic expectations.  His focus is getting to his family.  I suspect it would be safer for his family to come here.  The patient had a prognosis of hours to days.  After his nephew arrives on Saturday, I would recommend send the patient to palliative care unit for end-of-life comfort; however, it is too late to make this decision and the patient becomes stressed easily.  It would be most appropriate to wait until his nephew is here and they can decide on this disposition together.  The patient would also benefit from hospice and hospice home in the setting of profound lung disease/end-stage restrictive lung disease and COPD, O2 dependent.  For now, we will keep in 2500, initiate symptom management, wait for nephew to arrive on Saturday to discuss disposition with the patient.  Thank you for the opportunity to assist with this patient's service.  I can be his reached at  [phone].  I have also spoken with the case manager with the hospitalist team today.     Dorian Pod, ACNP   ______________________________ Katharina Caper. Ladona Ridgel, MD    MB/MEDQ  D:  03/08/2010  T:  03/09/2010  Job:  [id number]  Electronically Signed by Dorian Pod ACNP on 03/20/2010 08:59:12 AM Electronically Signed by Derenda Mis MD on 04/15/2010 12:41:26 PM

## 2010-04-16 ENCOUNTER — Ambulatory Visit: Payer: Self-pay | Admitting: Thoracic Surgery

## 2010-04-17 NOTE — Letter (Signed)
Summary: [facility]  [facility]   Imported By: Lennie Odor 04/12/2010 10:00:19  _____________________________________________________________________  External Attachment:    Type:   Image     Comment:   External Document

## 2010-05-07 LAB — DIGOXIN LEVEL: Digoxin Level: 0.3 ng/mL — ABNORMAL LOW (ref 0.8–2.0)

## 2010-05-07 LAB — BASIC METABOLIC PANEL
BUN: 16 mg/dL (ref 6–23)
CO2: 33 mEq/L — ABNORMAL HIGH (ref 19–32)
Calcium: 8.5 mg/dL (ref 8.4–10.5)
Chloride: 100 mEq/L (ref 96–112)
Creatinine, Ser: 0.66 mg/dL (ref 0.4–1.5)
GFR calc Af Amer: >60 mL/min (ref >60)
GFR calc non Af Amer: >60 mL/min (ref >60)
Glucose, Bld: 96 mg/dL (ref 70–99)
Potassium: 4.5 mEq/L (ref 3.5–5.1)
Sodium: 137 mEq/L (ref 135–145)

## 2010-05-07 LAB — CULTURE, RESPIRATORY W GRAM STAIN
Culture: NORMAL
Gram Stain: NONE SEEN

## 2010-05-07 LAB — EXPECTORATED SPUTUM ASSESSMENT W GRAM STAIN, RFLX TO RESP C

## 2010-05-08 LAB — POCT I-STAT 3, ART BLOOD GAS (G3+)
Acid-Base Excess: 3 mmol/L — ABNORMAL HIGH (ref 0.0–2.0)
O2 Saturation: 97 %
O2 Saturation: 98 %
Patient temperature: 97.6
pCO2 arterial: 46.6 mmHg — ABNORMAL HIGH (ref 35.0–45.0)
pCO2 arterial: 47 mmHg — ABNORMAL HIGH (ref 35.0–45.0)
pH, Arterial: 7.449 (ref 7.350–7.450)

## 2010-05-08 LAB — BASIC METABOLIC PANEL
BUN: 14 mg/dL (ref 6–23)
BUN: 17 mg/dL (ref 6–23)
BUN: 18 mg/dL (ref 6–23)
BUN: 18 mg/dL (ref 6–23)
BUN: 20 mg/dL (ref 6–23)
BUN: 23 mg/dL (ref 6–23)
BUN: 31 mg/dL — ABNORMAL HIGH (ref 6–23)
CO2: 27 mEq/L (ref 19–32)
CO2: 28 mEq/L (ref 19–32)
CO2: 29 mEq/L (ref 19–32)
CO2: 30 mEq/L (ref 19–32)
CO2: 30 mEq/L (ref 19–32)
CO2: 30 mEq/L (ref 19–32)
CO2: 33 mEq/L — ABNORMAL HIGH (ref 19–32)
CO2: 37 mEq/L — ABNORMAL HIGH (ref 19–32)
Calcium: 8 mg/dL — ABNORMAL LOW (ref 8.4–10.5)
Calcium: 8.3 mg/dL — ABNORMAL LOW (ref 8.4–10.5)
Calcium: 8.4 mg/dL (ref 8.4–10.5)
Calcium: 8.5 mg/dL (ref 8.4–10.5)
Calcium: 8.7 mg/dL (ref 8.4–10.5)
Calcium: 8.9 mg/dL (ref 8.4–10.5)
Calcium: 9 mg/dL (ref 8.4–10.5)
Calcium: 9 mg/dL (ref 8.4–10.5)
Chloride: 100 mEq/L (ref 96–112)
Chloride: 101 mEq/L (ref 96–112)
Chloride: 103 mEq/L (ref 96–112)
Chloride: 105 mEq/L (ref 96–112)
Chloride: 91 mEq/L — ABNORMAL LOW (ref 96–112)
Chloride: 97 mEq/L (ref 96–112)
Chloride: 99 mEq/L (ref 96–112)
Creatinine, Ser: 0.63 mg/dL (ref 0.4–1.5)
Creatinine, Ser: 0.68 mg/dL (ref 0.4–1.5)
Creatinine, Ser: 0.7 mg/dL (ref 0.4–1.5)
Creatinine, Ser: 0.72 mg/dL (ref 0.4–1.5)
Creatinine, Ser: 0.75 mg/dL (ref 0.4–1.5)
Creatinine, Ser: 0.75 mg/dL (ref 0.4–1.5)
Creatinine, Ser: 0.76 mg/dL (ref 0.4–1.5)
Creatinine, Ser: 0.78 mg/dL (ref 0.4–1.5)
Creatinine, Ser: 0.99 mg/dL (ref 0.4–1.5)
GFR calc Af Amer: >60 mL/min (ref >60)
GFR calc Af Amer: >60 mL/min (ref >60)
GFR calc Af Amer: >60 mL/min (ref >60)
GFR calc Af Amer: >60 mL/min (ref >60)
GFR calc Af Amer: >60 mL/min (ref >60)
GFR calc Af Amer: >60 mL/min (ref >60)
GFR calc Af Amer: >60 mL/min (ref >60)
GFR calc Af Amer: >60 mL/min (ref >60)
GFR calc non Af Amer: >60 mL/min (ref >60)
GFR calc non Af Amer: >60 mL/min (ref >60)
GFR calc non Af Amer: >60 mL/min (ref >60)
GFR calc non Af Amer: >60 mL/min (ref >60)
GFR calc non Af Amer: >60 mL/min (ref >60)
GFR calc non Af Amer: >60 mL/min (ref >60)
GFR calc non Af Amer: >60 mL/min (ref >60)
GFR calc non Af Amer: >60 mL/min (ref >60)
Glucose, Bld: 104 mg/dL — ABNORMAL HIGH (ref 70–99)
Glucose, Bld: 117 mg/dL — ABNORMAL HIGH (ref 70–99)
Glucose, Bld: 154 mg/dL — ABNORMAL HIGH (ref 70–99)
Glucose, Bld: 93 mg/dL (ref 70–99)
Glucose, Bld: 95 mg/dL (ref 70–99)
Glucose, Bld: 95 mg/dL (ref 70–99)
Potassium: 3.4 mEq/L — ABNORMAL LOW (ref 3.5–5.1)
Potassium: 3.7 mEq/L (ref 3.5–5.1)
Potassium: 3.8 mEq/L (ref 3.5–5.1)
Potassium: 3.9 mEq/L (ref 3.5–5.1)
Potassium: 4.1 mEq/L (ref 3.5–5.1)
Potassium: 4.6 mEq/L (ref 3.5–5.1)
Potassium: 4.9 mEq/L (ref 3.5–5.1)
Sodium: 135 mEq/L (ref 135–145)
Sodium: 136 mEq/L (ref 135–145)
Sodium: 136 mEq/L (ref 135–145)
Sodium: 136 mEq/L (ref 135–145)
Sodium: 137 mEq/L (ref 135–145)
Sodium: 137 mEq/L (ref 135–145)
Sodium: 138 mEq/L (ref 135–145)

## 2010-05-08 LAB — DIFFERENTIAL
Basophils Absolute: 0 10*3/uL (ref 0.0–0.1)
Lymphocytes Relative: 26 % (ref 12–46)
Lymphocytes Relative: 9 % — ABNORMAL LOW (ref 12–46)
Lymphs Abs: 2.3 10*3/uL (ref 0.7–4.0)
Monocytes Absolute: 1.4 10*3/uL — ABNORMAL HIGH (ref 0.1–1.0)
Monocytes Relative: 16 % — ABNORMAL HIGH (ref 3–12)
Neutro Abs: 10.3 10*3/uL — ABNORMAL HIGH (ref 1.7–7.7)
Neutro Abs: 4.4 10*3/uL (ref 1.7–7.7)
Neutrophils Relative %: 51 % (ref 43–77)

## 2010-05-08 LAB — POCT CARDIAC MARKERS
CKMB, poc: 1 ng/mL (ref 1.0–8.0)
CKMB, poc: 1.5 ng/mL (ref 1.0–8.0)
Myoglobin, poc: 91.1 ng/mL (ref 12–200)
Troponin i, poc: <0.05 ng/mL (ref 0.00–0.09)
Troponin i, poc: <0.05 ng/mL (ref 0.00–0.09)

## 2010-05-08 LAB — CBC
HCT: 32.4 % — ABNORMAL LOW (ref 39.0–52.0)
HCT: 33.2 % — ABNORMAL LOW (ref 39.0–52.0)
HCT: 38.1 % — ABNORMAL LOW (ref 39.0–52.0)
Hemoglobin: 10.9 g/dL — ABNORMAL LOW (ref 13.0–17.0)
Hemoglobin: 11.1 g/dL — ABNORMAL LOW (ref 13.0–17.0)
Hemoglobin: 12.2 g/dL — ABNORMAL LOW (ref 13.0–17.0)
MCH: 30.7 pg (ref 26.0–34.0)
MCH: 30.7 pg (ref 26.0–34.0)
MCH: 31.1 pg (ref 26.0–34.0)
MCHC: 33.4 g/dL (ref 30.0–36.0)
MCHC: 33.6 g/dL (ref 30.0–36.0)
MCV: 92 fL (ref 78.0–100.0)
MCV: 92.3 fL (ref 78.0–100.0)
Platelets: 385 10*3/uL (ref 150–400)
Platelets: 401 10*3/uL — ABNORMAL HIGH (ref 150–400)
Platelets: 445 10*3/uL — ABNORMAL HIGH (ref 150–400)
Platelets: 456 10*3/uL — ABNORMAL HIGH (ref 150–400)
RBC: 3.51 MIL/uL — ABNORMAL LOW (ref 4.22–5.81)
RBC: 3.61 MIL/uL — ABNORMAL LOW (ref 4.22–5.81)
RBC: 3.61 MIL/uL — ABNORMAL LOW (ref 4.22–5.81)
RBC: 4.09 MIL/uL — ABNORMAL LOW (ref 4.22–5.81)
RDW: 12.6 % (ref 11.5–15.5)
RDW: 13 % (ref 11.5–15.5)
RDW: 13.4 % (ref 11.5–15.5)
WBC: 10.5 10*3/uL (ref 4.0–10.5)
WBC: 11.9 10*3/uL — ABNORMAL HIGH (ref 4.0–10.5)
WBC: 12.9 10*3/uL — ABNORMAL HIGH (ref 4.0–10.5)
WBC: 13 10*3/uL — ABNORMAL HIGH (ref 4.0–10.5)

## 2010-05-08 LAB — POCT I-STAT, CHEM 8
BUN: 25 mg/dL — ABNORMAL HIGH (ref 6–23)
Calcium, Ion: 1.11 mmol/L — ABNORMAL LOW (ref 1.12–1.32)
Chloride: 104 mEq/L (ref 96–112)
Creatinine, Ser: 0.8 mg/dL (ref 0.4–1.5)
Glucose, Bld: 139 mg/dL — ABNORMAL HIGH (ref 70–99)
HCT: 42 % (ref 39.0–52.0)
Potassium: 3.7 mEq/L (ref 3.5–5.1)

## 2010-05-08 LAB — BLOOD GAS, ARTERIAL
Acid-Base Excess: 1.2 mmol/L (ref 0.0–2.0)
Bicarbonate: 28.1 mEq/L — ABNORMAL HIGH (ref 20.0–24.0)
Drawn by: 27733
FIO2: 0.4 %
MECHVT: 400 mL
O2 Saturation: 98.9 %
PEEP: 0 cmH2O
Patient temperature: 97.3
RATE: 14 resp/min
TCO2: 30.3 mmol/L (ref 0–100)
pCO2 arterial: 68.3 mmHg (ref 35.0–45.0)
pH, Arterial: 7.233 — ABNORMAL LOW (ref 7.350–7.450)
pO2, Arterial: 177 mmHg — ABNORMAL HIGH (ref 80.0–100.0)

## 2010-05-08 LAB — CULTURE, RESPIRATORY W GRAM STAIN

## 2010-05-08 LAB — TYPE AND SCREEN
ABO/RH(D): A POS
Antibody Screen: NEGATIVE

## 2010-05-08 LAB — D-DIMER, QUANTITATIVE: D-Dimer, Quant: 0.89 ug/mL-FEU — ABNORMAL HIGH (ref 0.00–0.48)

## 2010-05-08 LAB — MRSA PCR SCREENING
MRSA by PCR: NEGATIVE
MRSA by PCR: NEGATIVE

## 2010-05-08 LAB — MAGNESIUM: Magnesium: 2.3 mg/dL (ref 1.5–2.5)

## 2010-05-08 LAB — PHOSPHORUS: Phosphorus: 3.8 mg/dL (ref 2.3–4.6)

## 2010-05-08 LAB — BRAIN NATRIURETIC PEPTIDE: Pro B Natriuretic peptide (BNP): 78 pg/mL (ref 0.0–100.0)

## 2010-06-06 LAB — DIFFERENTIAL
Basophils Absolute: 0 10*3/uL (ref 0.0–0.1)
Basophils Relative: 0 % (ref 0–1)
Eosinophils Absolute: 0.2 K/uL (ref 0.0–0.7)
Eosinophils Relative: 3 % (ref 0–5)
Lymphocytes Relative: 22 % (ref 12–46)
Lymphs Abs: 1.3 K/uL (ref 0.7–4.0)
Monocytes Absolute: 1.4 10*3/uL — ABNORMAL HIGH (ref 0.1–1.0)
Monocytes Relative: 24 % — ABNORMAL HIGH (ref 3–12)
Neutro Abs: 2.9 10*3/uL (ref 1.7–7.7)
Neutrophils Relative %: 51 % (ref 43–77)

## 2010-06-06 LAB — BASIC METABOLIC PANEL
Calcium: 8.9 mg/dL (ref 8.4–10.5)
GFR calc Af Amer: >60 mL/min (ref >60)
GFR calc non Af Amer: >60 mL/min (ref >60)
Glucose, Bld: 109 mg/dL — ABNORMAL HIGH (ref 70–99)
Sodium: 137 mEq/L (ref 135–145)

## 2010-06-06 LAB — CBC
HCT: 38 % — ABNORMAL LOW (ref 39.0–52.0)
Hemoglobin: 13.4 g/dL (ref 13.0–17.0)
MCHC: 35.3 g/dL (ref 30.0–36.0)
MCV: 94 fL (ref 78.0–100.0)
Platelets: 380 K/uL (ref 150–400)
RBC: 4.05 MIL/uL — ABNORMAL LOW (ref 4.22–5.81)
RDW: 12.5 % (ref 11.5–15.5)
WBC: 5.8 K/uL (ref 4.0–10.5)

## 2010-06-06 LAB — BASIC METABOLIC PANEL WITH GFR
BUN: 20 mg/dL (ref 6–23)
CO2: 24 meq/L (ref 19–32)
Chloride: 105 meq/L (ref 96–112)
Creatinine, Ser: 0.8 mg/dL (ref 0.4–1.5)
Potassium: 4.1 meq/L (ref 3.5–5.1)

## 2010-07-09 NOTE — Assessment & Plan Note (Signed)
OFFICE VISIT   Cody, Shea  DOB:  12/16/1927                                        February 19, 2010  CHART #:  [id number]   HISTORY OF PRESENT ILLNESS:  This is an 77 year old Caucasian male with  a past medical history of silicosis and remote tobacco abuse.  The  patient initially presented with a recurrent spontaneous left  pneumothorax.  He required placement of a left chest tube x2 by Dr. Donata Clay on January 11, 2010.  In addition, because the patient had a  persistent air leak he required insertion of endobronchial valve x3 by  Dr. Edwyna Shell on January 21, 2010.  The patient also experienced PAF with  RVR.  He ultimately was placed back on verapamil, and digoxin was also  added for controlled ventricular rate.  He was surgically stable for  discharge from [facility] facility on February 01, 2010.  The patient  presents today for routine followup.  The patient has complaints of  weakness, shortness of breath, and productive cough (the patient has a  history of productive cough, but this appears to be a little more worse  than previously).  The patient denies any fever or chills.   PHYSICAL EXAMINATION:  General:  This is an 77 year old Caucasian male  who is accompanied by family who is in no acute distress who is alert,  oriented, and cooperative.  Vital Signs:  BP 99/49, heart rate 76,  respiration rate 22, O2 sat 93% on 3 L oxygen via nasal cannula.  Cardiovascular:  Regular rate and rhythm.  Pulmonary:  Positive rhonchi.  Abdomen:  Soft, nontender.  Bowel sounds present.  Chest:  Left chest  tube wounds are clean, dry, and well healed.  Extremities:  Positive 2+  ankle edema.   IMAGING:  Chest x-ray done today shows no pneumothorax, severe chronic  interstitial obstructive lung disease with numerous calcified lymph  nodes in the mediastinum and hilar areas and endobronchial valve on the  left.   IMPRESSION AND PLAN:  Pulmonary, as  previously stated, the patient has a  history of silicosis and chronic sputum production.  However, it is felt  that he likely has bronchitis now as a result.  He was seen and  evaluated by Dr. Donata Clay and was placed on Ceftin 500 mg p.o. 2 times  daily for 7 days.  In addition, Ventolin HFA will be given 2 inhalations  q.i.d., not p.r.n.  The patient was encouraged to continue with physical  therapy as he is still very deconditioned.  He is going to return to see  Dr. Edwyna Shell on December 29, 2009, to determine whether or not the  endobronchial valves are going to be removed.  Also, the patient states  he has an appointment to see Dr. Maple Hudson on February 27, 2010.  The patient  is going to return to the nursing facility as he is still weak and  deconditioned and has very limited exercise tolerance.  He will further  be evaluated after seeing Dr. Maple Hudson and Dr. Edwyna Shell as to whether or not  he can be discharged home thereafter.   Doree Fudge, PA   DZ/MEDQ  D:  02/19/2010  T:  02/20/2010  Job:  [id number]   cc:   Joni Fears D. Maple Hudson, MD, FCCP, FACP

## 2010-07-09 NOTE — Letter (Signed)
February 27, 2010   Clinton D. Maple Hudson, MD, FCCP, FACP  [facility]-Pulmonary Dept  [address]  [place], [place] [address]   Re:  Cody Shea, Cody Shea               DOB:  Nov 03, 1927   Dear Joni Fears,   I saw the patient today and he still had [facility].  His  blood pressure was 124/70, pulse 76, respirations 18, sats were 96%.  Chest x-ray was stable although there is a question of a right midlung  infiltrate.  He could see 3 IBV in place and they had not moved.  There  is no evidence of any distal collapse.  He has really stabilized, but I  am not sure it is worth taking a risk of general anesthesia to remove  the valves at the present time.  He is going to [facility] [facility] on the 12th  and will be set with a pulmonologist there, and I discussed in great  detail with his family that we will wait another 6 weeks and at that  time get another x-ray and depending upon his performance status, we may  consider removal at that time.  I did tell him that, although it is  recommend to remove these at 6 weeks that I left him as long as 8 months  and that maybe what we will have to do with him.  I appreciate the  opportunity of seeing the patient.   Ines Bloomer, M.D.  Electronically Signed   DPB/MEDQ  D:  02/27/2010  T:  02/28/2010  Job:  [id number]   cc:   Kerin Perna, M.D.

## 2010-07-09 NOTE — Assessment & Plan Note (Signed)
[facility]                             PULMONARY OFFICE NOTE   NAME:Cody Shea, Cody Shea                      MRN:          [id number]  DATE:08/05/2006                            DOB:          05/16/1927    PROBLEMS:  1. Pulmonary silicosis.  2. Chronic bronchitis/chronic obstructive pulmonary disease.  3. Lung nodule (negative by PET scan).   HISTORY:  He says that paint smell triggered an acute episode of  shortness of breath and chest tightness which has resolved. Today he  feels well. Home nebulizer has been a big help and triggers productive  coughing which leaves feeling clearer. Nothing bloody or purulent. No  chest pain or fever.   MEDICATIONS:  1. Diltiazem ER 240 mg.  2. Advair 250/50 b.i.d.  3. Xopenex by nebulizer 1.25 mg up to t.i.d. p.r.n.  4. Gemfibrozil 600 mg.  5. Albuterol inhaler p.r.n.   MEDICATION INTOLERANCES:  1. ASPIRIN.  2. CODEINE.  3. DOXYCYCLINE.   His last chest x-ray on January 18 had shown interval improvement and  near complete resolution of the left mid-lung infiltrate with a small  residual left plural effusion and advanced changes of silicosis and  chronic obstructive pulmonary disease. A CT scan last December had shown  small nodules and recommend follow up which we discussed. We are going  to do that when he returns as scheduled in four months.   OBJECTIVE:  Weight 121 pounds, blood pressure 130/72, pulse regular 80,  room air saturation 98%. There are minimal rhonchi. He is alert and  talkative, walking with a cane, prosthetic eye. Heart sounds regular  without murmur. I do not find adenopathy or edema.   IMPRESSION:  Silicosis (502). Chronic obstructive pulmonary disease.  Lung nodules.   PLAN:  We refilled Advair 250/50. Schedule return 4 months anticipating  we will schedule a follow up CT at that visit.     Clinton D. Maple Hudson, MD, Tonny Bollman, FACP  Electronically Signed    CDY/MedQ  DD: 08/09/2006   DT: 08/10/2006  Job #: [id number]   cc:   Lucky Cowboy, M.D.  Madaline Savage, M.D.  Special [employer] Commission (25-A)

## 2010-07-09 NOTE — Assessment & Plan Note (Signed)
[facility]                             PULMONARY OFFICE NOTE   NAME:Cody Shea, Cody Shea                      MRN:          [id number]  DATE:12/10/2006                            DOB:          [birth year]    PROBLEMS:  1. Pulmonary silicosis.  2. Chronic bronchitis/chronic obstructive pulmonary disease.   HISTORY:  He says that exertional dyspnea now keeps him from cleaning  his own home. He just cannot push the vacuum cleaner around, so he has a  housekeeper come in twice a month. He feels that he walks somewhat  slower. There has been no dramatic change, just a gradual loss over a  period of a year or so. Dr. Oneta Rack had asked him about his PPD status.  He had a negative PPD skin test in 2004 and in 1998. He had pneumococcal  vaccine in 1996 and in 2005. He does not know of an exposure to  tuberculosis. He has not been having night sweats, purulent sputum,  chest pain, palpitation, fever, or adenopathy. There has been no  bleeding. Sputum is white. He was evaluated for syncope.   MEDICATIONS:  1. Diltiazem ER 240 mg.  2. Advair 250/50 b.i.d.  3. Home nebulizer with Xopenex 1.25 mg t.i.d. p.r.n.  4. Gemfibrozil 600 mg.  5. Albuterol rescue inhaler (Pro Air).   Drug intolerant ASPIRIN, CODEINE, and DOXYCYCLINE.   OBJECTIVE:  Weight 126 pounds, blood pressure 116/60, pulse 61, room air  saturation 98%. Pulse is regular. I do not hear murmur. I do not find  adenopathy or peripheral edema. There is no cyanosis. Mild hoarseness.  There are a few very faint crackles, but nothing definite. Work of  breathing is unlabored at rest.   IMPRESSION:  Stable pulmonary silicosis with chronic obstructive  pulmonary disease, gradually declining exercise tolerance. He is  encouraged to walk as able. We have offered pulmonary rehab in the past.   PLAN:  1. CT chest follow up previous nodularity associated with silicosis.  2. Flu vaccine was given.  3. PPD skin  test placed.  4. Schedule return 4 months, earlier p.r.n.     Clinton D. Maple Hudson, MD, Tonny Bollman, FACP  Electronically Signed    CDY/MedQ  DD: 12/11/2006  DT: 12/12/2006  Job #: [id number]   cc:   Lucky Cowboy, M.D.  Madaline Savage, M.D.  Special [employer] Commision (25-A)
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
## 2010-07-12 NOTE — Assessment & Plan Note (Signed)
[facility]                               PULMONARY OFFICE NOTE   NAME:Cody Shea, Cody Shea                      MRN:          [id number]  DATE:10/28/2005                            DOB:          May 04, 1927    PULMONARY FOLLOWUP:   PROBLEMS:  1. Silicosis.  2. Chronic bronchitis.  3. Lung nodule.   HISTORY:  He has been  staying indoors in air conditioning as much as he  could this summer to avoid the air quality problems.  The recent physical  exam, he said, went well.  He tends to keep some wheeze and cough, a whitish  or cream-colored sputum most days.  He denies chest pain, fever, adenopathy,  blood, or palpitations.  He sleeps with his head of bed elevated using his  albuterol on a regular basis q.i.d. and feels that he is stable like that.   MEDICATIONS:  1. Advair 250/50.  2. Prednisone 10 mg daily.  3. Albuterol rescue inhaler.  4. Mucinex.   Drug intolerant of ASPIRIN and CODEINE.   OBJECTIVE:  VITAL SIGNS:  Weight 130 pounds.  BP 134/78.  Pulse regular at  92.  Room air saturation 95%.  GENERAL:  He walks with a cane.  Prosthetic left eye.  RESPIRATORY:  Expiratory wheeze in both bases with hyperinflated chest and  distant lung sounds.  HEART:  Regular without murmur or gallop.  No neck vein distention or edema.  No clubbing.   Chest CT scan of May 1 had shown interval development of an apparent mass in  the right upper lobe and also in the right lower lobe with chronic changes  of silicosis, including multiple nodules and some cavitation as well as COPD  changes.  We discussed the significance of the apparently new irregular  densities.   IMPRESSION:  Two nodules, which may be new, but difficult to be sure in the  background of his significant silicosis with progressive massive fibrosis  and cavitation on a background of chronic obstructive pulmonary disease.  He  is shy of any biopsy or invasive procedure but willing to discuss  if there  seems to be little choice.  Symptomatically, he seems stable for now.   PLAN:  In three months, he is going to help [place] get together with him to set  up a follow-up CT scan to compare with his study for May.  If there is  definite growth at that time, then we will  consider either a PET scan or a needle biopsy.  Schedule return with me in  three months, earlier p.r.n.                                   Clinton D. Maple Hudson, MD, FCCP, FACP   CDY/MedQ  DD:  10/28/2005  DT:  10/29/2005  Job #:  [id number]   cc:   Lucky Cowboy, M.D.

## 2010-07-12 NOTE — Assessment & Plan Note (Signed)
[facility]                             PULMONARY OFFICE NOTE   NAME:Cody Shea, Cody Shea                      MRN:          [id number]  DATE:03/30/2006                            DOB:          01/08/1928    PROBLEM LIST:  1. Pulmonary silicosis.  2. Chronic bronchitis/chronic obstructive pulmonary disease.  3. Lung nodule (negative by PET scan).   HISTORY OF PRESENT ILLNESS:  He had called in mid January thinking  doxycycline caused chest tightness; apparently unrelated. He had gone to  the emergency room feeling palpitation. He says he has had two syncopal  episodes and that Dr. Elsie Lincoln knows about these. He had been hospitalized  January 3 with a pneumonia. Fortunately today he says he is feeling  pretty well especially after he was able to cough out a mucous plug.   MEDICATIONS:  1. Advair 250/50.  2. Mucus Relief.  3. p.r.n. use of albuterol.   MEDICATION ALLERGIES/INTOLERANCES:  Medication intolerance of ASPIRIN,  CODEINE, and DOXYCYCLINE.   OBJECTIVE:  VITAL SIGNS:  Weight 122 pounds, BP 122/70, pulse regular  67, room air saturation 97%.  GENERAL:  He seems alert and calm.  CHEST:  Quiet chest with a few faint crackles. Work of breathing has not  increased on room air at rest.  HEART:  Heart sounds currently are regular to palpitation. I do not hear  a murmur.  HEENT:  He is doing some throat clearing with no redness or mucus seen.  Dry cough.  EXTREMITIES:  No cyanosis or clubbing.   IMPRESSION:  1. Silicosis.  2. Chronic obstructive pulmonary disease exacerbation.  3. Previous lung nodules probably part of a scarring process.  4. Syncopal episodes and palpitations suggesting possibility of Stokes-      Adams attacks or tussive syncope. He is being followed by Dr.      Elsie Lincoln.   PLAN:  1. We are getting him a home nebulizer machine to use Xopenex 1.25 mg      up to q.i.d. p.r.n.  2. Schedule return in six weeks or earlier  p.r.n.     Clinton D. Maple Hudson, MD, Tonny Bollman, FACP  Electronically Signed    CDY/MedQ  DD: 03/30/2006  DT: 03/31/2006  Job #: [id number]   cc:   Lucky Cowboy, M.D.  Madaline Savage, M.D.

## 2010-07-12 NOTE — Assessment & Plan Note (Signed)
[facility]                               PULMONARY OFFICE NOTE   NAME:Cody Shea, Cody Shea                      MRN:          [id number]  DATE:12/29/2005                            DOB:          08/06/1927    PROBLEM:  1. Pulmonary silicosis.  2. Chronic bronchitis/COPD.  3. Lung nodule.   HISTORY:  He has had flu vaccine.  Returns now to setup a followup chest CT  as planned, tracking lung nodule.  Chest CT in May had shown interval  development of a 1.6 x 1.5 cm irregular mass in the inferior aspect of the  right upper lobe, a 1.3 x 0.7 cm mass in the lateral aspect of the right  lower lobe, stable changes of silicosis with multiple bilateral parenchymal  and subpleural nodules, stable changes of COPD and stable cavities in both  upper lobes, thought to represent areas of cavitary progressive massive  fibrosis associated with silicosis.  Aspergillosis was not excluded.  Pulmonary function test in January 2007 had shown severe obstructive disease  with an FEV1 of 1.25 (50% predicted) and an FEV1/FEC ratio of 0.27.  Little  response to bronchodilator.  Diffusion capacity 63% of predicted.  Total  lung capacity was hyperinflated despite his silicosis at 130% predicted.  Symptomatically he is stable.  He fights to try to keep his weight up but  this is his lifetime pattern.  Morning cough with white phlegm, no blood, no  chest pain, no acute event.   MEDICATION:  1. Advair 250/50 one b.i.d.  2. MiraLax p.r.n.  3. Prednisone was discontinued.  4. His is using an over-the-counter mucus thinner.  5. Rescue albuterol inhaler used occasionally   Drug intolerant to ASPIRIN and CODEINE.   OBJECTIVE:  Weight 127 pounds. Blood pressure 140/88, pulse regular 91.  Room air saturation 96%.  There are a few rhonchi in the right base with no dullness.  Work of  breathing is not increased at rest.  HEART:  Sounds are regular without murmur.  I find no  adenopathy.  No enlargement of liver or spleen.  No clubbing, cyanosis or peripheral edema.  Prosthetic eye is noticed incidentally.   IMPRESSION:  1. Lung nodules need to be tracked and we can get a 6 month followup CT      now scheduled for December.  2. Chronic obstructive pulmonary disease is clinically stable.  3. Silicosis with progressive massive fibrosis.   PLAN:  1. We are scheduling a followup chest CT without contrast to be done in      December.  2. Schedule office follow up in 3 months, earlier as needed.     Clinton D. Maple Hudson, MD, Tonny Bollman, FACP  Electronically Signed    CDY/MedQ  DD: 12/29/2005  DT: 12/29/2005  Job #: [id number]   cc:   Lucky Cowboy, M.D.  [employer]. Commission (25-A)
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
## 2010-07-12 NOTE — Assessment & Plan Note (Signed)
[facility]                                 ON-CALL NOTE   NAME:[name], [name]                      MRN:          [id number]  DATE:03/13/2006                            DOB:          1928/01/05    Cody Shea calls today saying he is very short of breath following  doxycycline administration.  I asked him to stop the doxycycline and to  proceed with observation.  If he gets worse, to go to the emergency room  and to call Dr. Maple Hudson for an appointment the following week.     Cody Cradle Delford Field, MD, [facility]  Electronically Signed    PEW/MedQ  DD: 03/13/2006  DT: 03/13/2006  Job #: [id number]   cc:   Joni Fears D. Maple Hudson, MD, FCCP, FACP

## 2010-07-12 NOTE — Assessment & Plan Note (Signed)
[facility]                             PULMONARY OFFICE NOTE   NAME:Cody Shea, Cody Shea                      MRN:          [id number]  DATE:05/07/2006                            DOB:          03-Mar-1927    PROBLEM:  1. Pulmonary silicosis.  2. Chronic bronchitis/chronic obstructive pulmonary disease.  3. Lung nodule (negative by PET scan).   HISTORY:  He is using his nebulizer machine t.i.d. and it helps,  bringing up a little bit of white phlegm.  Strong smells bother him  enough that he is not going to the mall to walk.  He had had a chest x-  ray January 18, showing near-complete resolution of a left lung  infiltrate, as well as the chronic silicosis and COPD changes and a  small pleural effusion at that time.  He feels pretty well today.   MEDICATION:  1. Diltiazem ER 240 mg.  2. Advair 250/50.  3. Home nebulizer with Xopenex 1.25 mg t.i.d.  4. Rescue albuterol inhaler.   DRUG INTOLERANCE:  ASPIRIN, CODEINE, and DOXYCYCLINE.   OBJECTIVE:  Weight 120 pounds, BP 132/70, pulse 66, room air saturation  97%.  Note that he has been slowly losing weight, from 130 pounds in  September.  Breath sounds are diminished bilaterally with a few crackles  very faintly heard.  Work of breathing is not increased.  There is no  stridor, neck vein distention, adenopathy, cyanosis, clubbing or  peripheral edema.  I cannot feel liver or spleen.   IMPRESSION:  1. Silicosis.  2. COPD exacerbation.   He is encouraged to eat.  We will watch his weight, but he should follow  up with Dr. Oneta Rack on this, as well.   PLAN:  Return in three months, earlier p.r.n.     Clinton D. Maple Hudson, MD, Tonny Bollman, FACP  Electronically Signed    CDY/MedQ  DD: 05/07/2006  DT: 05/09/2006  Job #: [id number]   cc:   Lucky Cowboy, M.D.  Madaline Savage, M.D.

## 2010-07-12 NOTE — Discharge Summary (Signed)
NAME:  CALVERT, CHARLAND NO.:  [id number]   MEDICAL RECORD NO.:  [id number]          PATIENT TYPE:  INP   LOCATION:  4703                         FACILITY:  [facility]   PHYSICIAN:  Jonna L. Robb Matar, M.D.DATE OF BIRTH:  12/21/1927   DATE OF ADMISSION:  02/26/2006  DATE OF DISCHARGE:  03/01/2006                               DISCHARGE SUMMARY   PRIMARY CARE PHYSICIAN AND PULMONOLOGIST:  Joni Fears D. Maple Hudson, MD, FCCP,  FACP.   FINAL DIAGNOSES:  1. Bacterial pneumonia.  2. COPD with exacerbation.  3. Silicosis and pulmonary fibrosis.  4. Lung nodule.  5. Blocked APCs.   HISTORY:  This 77 year old gentleman with pulmonary fibrosis developed a  cough productive of yellow sputum and felt like when he had had  pneumonia five years ago.  No fevers, nausea, vomiting or hemoptysis.   PHYSICAL EXAMINATION:  Was notable for cachexia, decreased breath sounds  and occasional scattered wheeze.  No edema or cyanosis.   INITIAL LABORATORY DATA:  WBC 9.5, BUN 15, creatinine 0.8, BNP 38.  Chest x-ray showed left-sided mid-lung air space disease.   HOSPITAL COURSE:  The patient was put on IV Rocephin, azithromycin,  oxygen, albuterol and Advair.  He is now put on corticosteroids.  The  patient gradually improved on telemetry.  On January 4, he was noted to  have extrasystoles and examination showed the blocked APCs.   ALLERGIES:  NONE.   CODE STATUS:  DNR.   DISCHARGE:  The patient will be discharged on Advair 250 per 50 one puff  b.i.d., albuterol inhaler 2 puffs q.i.d. p.r.n., diltiazem 180 daily,  Ceftin 250 b.i.d. for a week and a Z-pak.  He is to call Dr. Maple Hudson in  the morning and be seen in one week.  He is to contact his physician if  he has any further issues.      Jonna L. Robb Matar, M.D.  Electronically Signed    JLB/MEDQ  D:  03/01/2006  T:  03/01/2006  Job:  [id number]   cc:   Joni Fears D. Maple Hudson, MD, FCCP, FACP

## 2010-07-12 NOTE — Assessment & Plan Note (Signed)
[facility]                             PULMONARY OFFICE NOTE   NAME:JUNOTNassir, Neidert                      MRN:          [id number]  DATE:03/12/2006                            DOB:          03/20/1927    PROBLEM LIST:  1. Pulmonary silicosis.  2. Chronic bronchitis/chronic obstructive pulmonary disease.  3. Lung nodule (negative by PET scan).   HISTORY:  He was hospitalized January 3 through March 01, 2006 at [facility] with the diagnosis of bacterial pneumonia, COPD with  exacerbation, silicosis and pulmonary fibrosis, lung nodule and blocked  PACs. He was on the Hospitalist service there and I was not consulted.  He says he is better now, still a little more short of breath than  usual. Scant sputum is clear. He was put on diltiazem to slow his heart  rate. A CT scan on December 10 described pulmonary parenchymal pattern  and mediastinal/bi-hilar calcified lymph nodes consistent with given  history of silicosis and reported three new nodules in the right lower  lobe measuring up to 6 mm in size, recommending follow-up in 6 months.  Portable chest x-ray on January 3rd at the hospital showed a new left  mid lung air space disease suspicious for pneumonia.   MEDICATION:  1. Advair 250/50 one puff b.i.d.  2. Albuterol 2 puffs x4 hours p.r.n.  3. Diltiazem 180 mg.  4. Ceftin 250 mg b.i.d.  5. Zithromax for one week.   OBJECTIVE:  Weight 123 pounds. Blood pressure 138/70, pulse regular 88,  room air saturation at rest now is 97%. Breath sounds are diminished but  clear. I do not hear crackles or wheeze despite his diagnosis. Heart  sounds are regular without murmur or gallop. There is no neck vein  distention or stridor, no cyanosis, clubbing or peripheral edema.  He  has a left eye prosthesis.   IMPRESSION:  Chronic silicosis and COPD now status post an acute  pneumonia which seems to be resolving appropriately. Small lung nodules  are likely scarring as part of his silicosis process.   PLAN:  1. Six-month follow-up.  2. Prescription to hold for Doxycycline x7 days in case of bronchitic      exacerbation this winter. Note - he has had flu vaccine and he is      up to date on pneumonia vaccine.   He will follow with Dr. Oneta Rack for primary care.     Clinton D. Maple Hudson, MD, Tonny Bollman, FACP  Electronically Signed    CDY/MedQ  DD: 03/15/2006  DT: 03/15/2006  Job #: [id number]   cc:   Lucky Cowboy, M.D.  [employer] Commission (25-A)

## 2010-07-12 NOTE — Discharge Summary (Signed)
[place]  Patient:    Cody, Shea                      MRN: [id number] Adm. Date:  [id number] Disc. Date: [id number] Attending:  Berry, Jonathan [place] Dictator:   [facility] CC:         Lennette Bihari, M.D., [facility]             Marinus Maw, M.D.             Clinton D. Maple Hudson, M.D.                           Discharge Summary  ADMISSION DIAGNOSES: 1. Unstable angina. 2. Chronic obstructive pulmonary disease and pulmonary silicosis with    fibrosis. 3. Degenerative joint disease. 4. Cervical spondylosis. 5. Mild obstructive benign prostatic hypertrophy. 6. History of hemoptysis followed by Dr. Maple Hudson in 1997. 7. History of gastrointestinal bleed with nonsteroidal anti-inflammatory drug    use in the past. 8. Status post cervical laminectomy with fusion in 1980. 9. Status post problems with left eye in 1989.  DISCHARGE DIAGNOSES: 1. Unstable angina, resolved with no acute electrocardiogram change and normal    cardiac enzymes. 2. Chronic obstructive pulmonary disease and pulmonary silicosis with    fibrosis. 3. Degenerative joint disease. 4. Cervical spondylosis. 5. Mild obstructive benign prostatic hypertrophy. 6. History of hemoptysis followed by Dr. Maple Hudson in 1997. 7. History of gastrointestinal bleed with nonsteroidal anti-inflammatory drug    use in the past. 8. Status post cervical laminectomy with fusion in 1980. 9. Status post problems with left eye in 1989.  HISTORY OF PRESENT ILLNESS:  Mr. Cody Shea is a 77 year old white widowed male with no prior cardiac history, who presented to our office on May 22, 1999 as an add-on evaluation for complaints of chest pain.  He did have a remote history of tobacco use but had no other positive cardiac risk factors.  As well, his wife had been a patient of Dr. Nicki Guadalajara and she had recently passed away in February 23, 2023 and he has been extremely  depressed and emotional since that time.  He stated that at 7 oclock that morning he had the onset of discomfort in his upper epigastric/chest region.  He described it as a dull pain.  He had no radiation of the pain; however, upon further questioning he stated that his left arm did feel as if it had a pulled muscle, which was new for him on that day.  He has chronic shortness of breath secondary to his pulmonary status but had not noticed any increased amount of shortness of breath.  As well, he had no diaphoresis, no nausea, and no vomiting.  He stated that the pain had started at about 7 a.m. and then had spontaneously decreased about the time that he got to Dr. Michaelle Birks office, which was about 10:30 a.m.  He had been given no nitroglycerins or other therapies.  While he was in the office at that time, he stated that he had a very minimal discomfort present in that epigastric/chest region.  At that time, Mr. Muhl was admitted from the office to telemetry at [facility].  He was planned for aspirin, Plavix, heparin; in addition to Imdur, Toprol, and Altace.  As well, we will continue on his current pulmonary inhalers.  We would also check serial cardiac enzymes  to rule out myocardial infarction.  It was felt that if his enzymes were negative and the EKG remained stable, he could follow up in our office with a Cardiolite the following day.  If that would not be the case, cardiac catheterization would be performed as warranted.  HOSPITAL COURSE:  On May 23, 1999, Mr. Ayer was feeling well with no further chest pressure.  All three sets of his cardiac enzymes were negative and his EKG is non-ischemic.  He is felt to be stable for discharge home at this time with followup dobutamine Cardiolite in our office at 1:45 p.m. today.  CONSULTATIONS:  None.  PROCEDURES:  None.  LABORATORY DATA:  Cardiac enzymes revealed CK of 156 and 130.  CK-MBs 3.4 and 0.8.  Troponin-I less than 0.03 x  3.  As well, the third set of CKs were also negative.  EKG reveals sinus bradycardia, 58 beats per minute, with nonspecific ST-T changes in aVL only and no other acute changes.  DISCHARGE MEDICATIONS: 1. Proventil HFA 2 sprays four times a day. 2. Serevent inhaler 2 sprays two times a day. 3. Atrovent inhaler 2 sprays four times a day. 4. Flovent 2 sprays two times a day.  DISCHARGE INSTRUCTIONS:  No strenuous activity until he sees Dr. Tresa Endo back in the office next.  FOLLOW-UP:  He is to follow up with a dobutamine Cardiolite in the office at 1:45 today.  He is to follow up with Dr. Tresa Endo on June 11, 1999 at 11 oclock a.m. DD:  05/24/99 TD:  05/24/99 Job: [id number]

## 2010-07-12 NOTE — H&P (Signed)
NAME:  Cody Shea, Cody Shea NO.:  [id number]   MEDICAL RECORD NO.:  [id number]          PATIENT TYPE:  INP   LOCATION:  1826                         FACILITY:  [facility]   PHYSICIAN:  Mobolaji B. Bakare, M.D.DATE OF BIRTH:  1927/06/19   DATE OF ADMISSION:  02/27/2004  DATE OF DISCHARGE:                                HISTORY & PHYSICAL   PRIMARY CARE PHYSICIAN:  Lucky Cowboy, M.D.   PULMONOLOGIST:  Rennis Chris. Young, M.D.   CHIEF COMPLAINT:  Cough, shortness of breath for five days.   HISTORY OF PRESENT ILLNESS:  Cody Shea is a 77 year old white male with a  history of chronic lung disease with silicosis being followed up by Dr.  Maple Hudson.  Five days ago he developed cough and shortness of breath.  The cough  was productive of greenish-yellow sputum, quite copious and thick.  No  pleuritic chest pain.  He did not have any fever until he got to the  emergency department when temperature was taken to be 101.8.  He had an ED  visit three days ago.  He was treated with nebulization, felt better, then  discharged home.  A chest x-ray done this time did not show any infiltrates  and chronic lung disease, stable.  The patient was empirically started on  antibiotics for presumed pneumonia in the emergency department with  Zithromax and ceftriaxone.  He also received nebulization and blood  cultures.  He denies nausea, vomiting, abdominal pain, diarrhea or  constipation.  The patient typically can walk about three blocks before he  becomes short of breath.  However, in the last five days he can hardly walk  50 feet.   REVIEW OF SYSTEMS:  Negative for chest pain, palpitations, diaphoresis.  No  urinary symptoms.  He feels musculoskeletal muscle pain no his chest.  He  did have blood tinge to his sputum on one occasion.  There is accompanying  headaches.   PAST MEDICAL HISTORY:  1.  Silicosis.  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  He was diagnosed with silicosis many years      ago.  2.  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  He does have implant      in the left eye.   PAST SURGICAL HISTORY:  Cervical spinal surgery.   MEDICATIONS:  1.  Advair.  2.  Albuterol nebulizers.   ALLERGIES:  ASPIRIN gives him blood in his stool.   SOCIAL HISTORY:  The patient quit smoking in 1991.  He smoked one pack per  week for 42 years.  He does not drink alcohol and no drug abuse.   FAMILY HISTORY:  He is a widow.  Wife died five years ago.  He does not any  children.  Next of kin is his nephew.  He does have a close friend in  [place] by the name of [name].   PHYSICAL EXAMINATION:  VITAL SIGNS:  On admission, temperature 101.8, blood  pressure 155/69, pulse 92, respiratory rate 28.  O2  saturation 95%.  GENERAL:  On examination, the patient is mildly dyspneic, tachypnea, using  accessory muscles of respirations.  HEENT:  Normocephalic, atraumatic head.  No elevated JVD.  No thyromegaly.  No carotid bruits.  Mucous membranes somewhat dry.  No exudates, no thrush.  LUNGS:  Poor lung inspiratory wheeze.  Bibasilar rales.  CARDIOVASCULAR:  S1, S2.  Regular, no murmur, no gallop, no rub.  ABDOMEN:  Not distended.  Soft, nontender.  No organomegaly.  Bowel sounds  present.  EXTREMITIES:  No pedal edema, calf tenderness or cyanosis.  CENTRAL NERVOUS SYSTEM:  Nonfocal.  SKIN:  No rash.   LABORATORY DATA:  Ph 7.44, pCO2 26.3, bicarbonate 25, base excess 1.  Hemoglobin 13.9, hematocrit 41.0.  Sodium 138, potassium 4.4, chloride 107,  glucose 112, BUN 57, creatinine 1.1.  White cells 9.1.  Platelets 521, 000,  neutrophils 67, lymphocytes 12, monocytes 22.  Chest x-ray  markedly  inflated lungs with patchy densities in upper lobes bilaterally.  Egg shell  calcifications seen in hilar and midsternal regions consistent with  calcified lymph nodes, __________ consistent with silicosis, and this has  remained  stable.   ASSESSMENT AND PLAN:  Cody Shea is a 77 year old Caucasian male with history  of silicosis and significant history of smoking, now presenting with cough,  yellowish-green sputum and fever.  Although the chest x-ray does not show  any infiltrates, symptoms are consistent with pneumonia on a baseline of  chronic lung disease.  1.  Pneumonia.  Continue Rocephin 1 g IV every day.  Zithromax 500 mg IV      every day. Blood cultures already taken.  Obtain sputum culture and gram      stain.  We will use Tessalon Pearles for cough.  The patient did not      receive the flu vaccine this year although he received Pneumovax vaccine      in the last 2-3 years.  We will give flu vaccine prior to discharge.  2.  Chronic lung fibrosis secondary to silicosis and possible underlying      chronic obstructive pulmonary disease- wil nebulizer with Atrovent and      albuterol.  Give IV Solu-Medrol.  3.  Azotemia - Will rehydrate  .  Deep vein thrombosis prophylaxis with Lovenox 40 mg subcu every day.  1.  Gastrointestinal prophylaxis with Protonix 40 mg every day.  2.  Code status:  I discussed code status with patient.  He does have a      living will which his friend will bring in tomorrow.  However, he states      that he does not want intubation.      Mobo   MBB/MEDQ  D:  02/28/2004  T:  02/28/2004  Job:  [id number]   cc:   Lucky Cowboy, M.D.  [address], [address]  [place], [place] [address]  Fax: [phone]   Pasty Spillers. Maple Hudson, M.D.  [address]  [place]  [place] [address]  Fax: [phone]

## 2010-12-10 LAB — DIFFERENTIAL
Basophils Absolute: 0
Basophils Relative: 0
Eosinophils Absolute: 0.3
Eosinophils Relative: 3
Lymphocytes Relative: 14
Lymphs Abs: 1.3
Monocytes Absolute: 1.1 — ABNORMAL HIGH
Monocytes Relative: 12 — ABNORMAL HIGH
Neutro Abs: 6.4
Neutrophils Relative %: 70

## 2010-12-10 LAB — CBC
HCT: 42.2
Hemoglobin: 14.1
MCHC: 33.4
MCV: 94.5
Platelets: 396
RBC: 4.46
RDW: 13
WBC: 9.1

## 2010-12-10 LAB — URINALYSIS, ROUTINE W REFLEX MICROSCOPIC
Bilirubin Urine: NEGATIVE
Glucose, UA: NEGATIVE
Hgb urine dipstick: NEGATIVE
Ketones, ur: NEGATIVE
Nitrite: NEGATIVE
Protein, ur: NEGATIVE
Specific Gravity, Urine: 1.012
Urobilinogen, UA: 0.2
pH: 5.5

## 2010-12-10 LAB — BASIC METABOLIC PANEL WITH GFR
CO2: 24
Calcium: 8.7
Creatinine, Ser: 0.97
GFR calc Af Amer: >60
GFR calc non Af Amer: >60
Glucose, Bld: 103 — ABNORMAL HIGH

## 2010-12-10 LAB — BASIC METABOLIC PANEL
BUN: 20
Chloride: 100
Potassium: 3.9
Sodium: 133 — ABNORMAL LOW

## 2010-12-10 LAB — OCCULT BLOOD X 1 CARD TO LAB, STOOL: Fecal Occult Bld: NEGATIVE

## 2012-04-18 IMAGING — CR DG CHEST 2V
2 series · 2 of 2 positions shown · non-contrast
Comparison: 08/01/2008

CLINICAL DATA: Difficulty breathing

CHEST - 2 VIEW

[w chest pa]
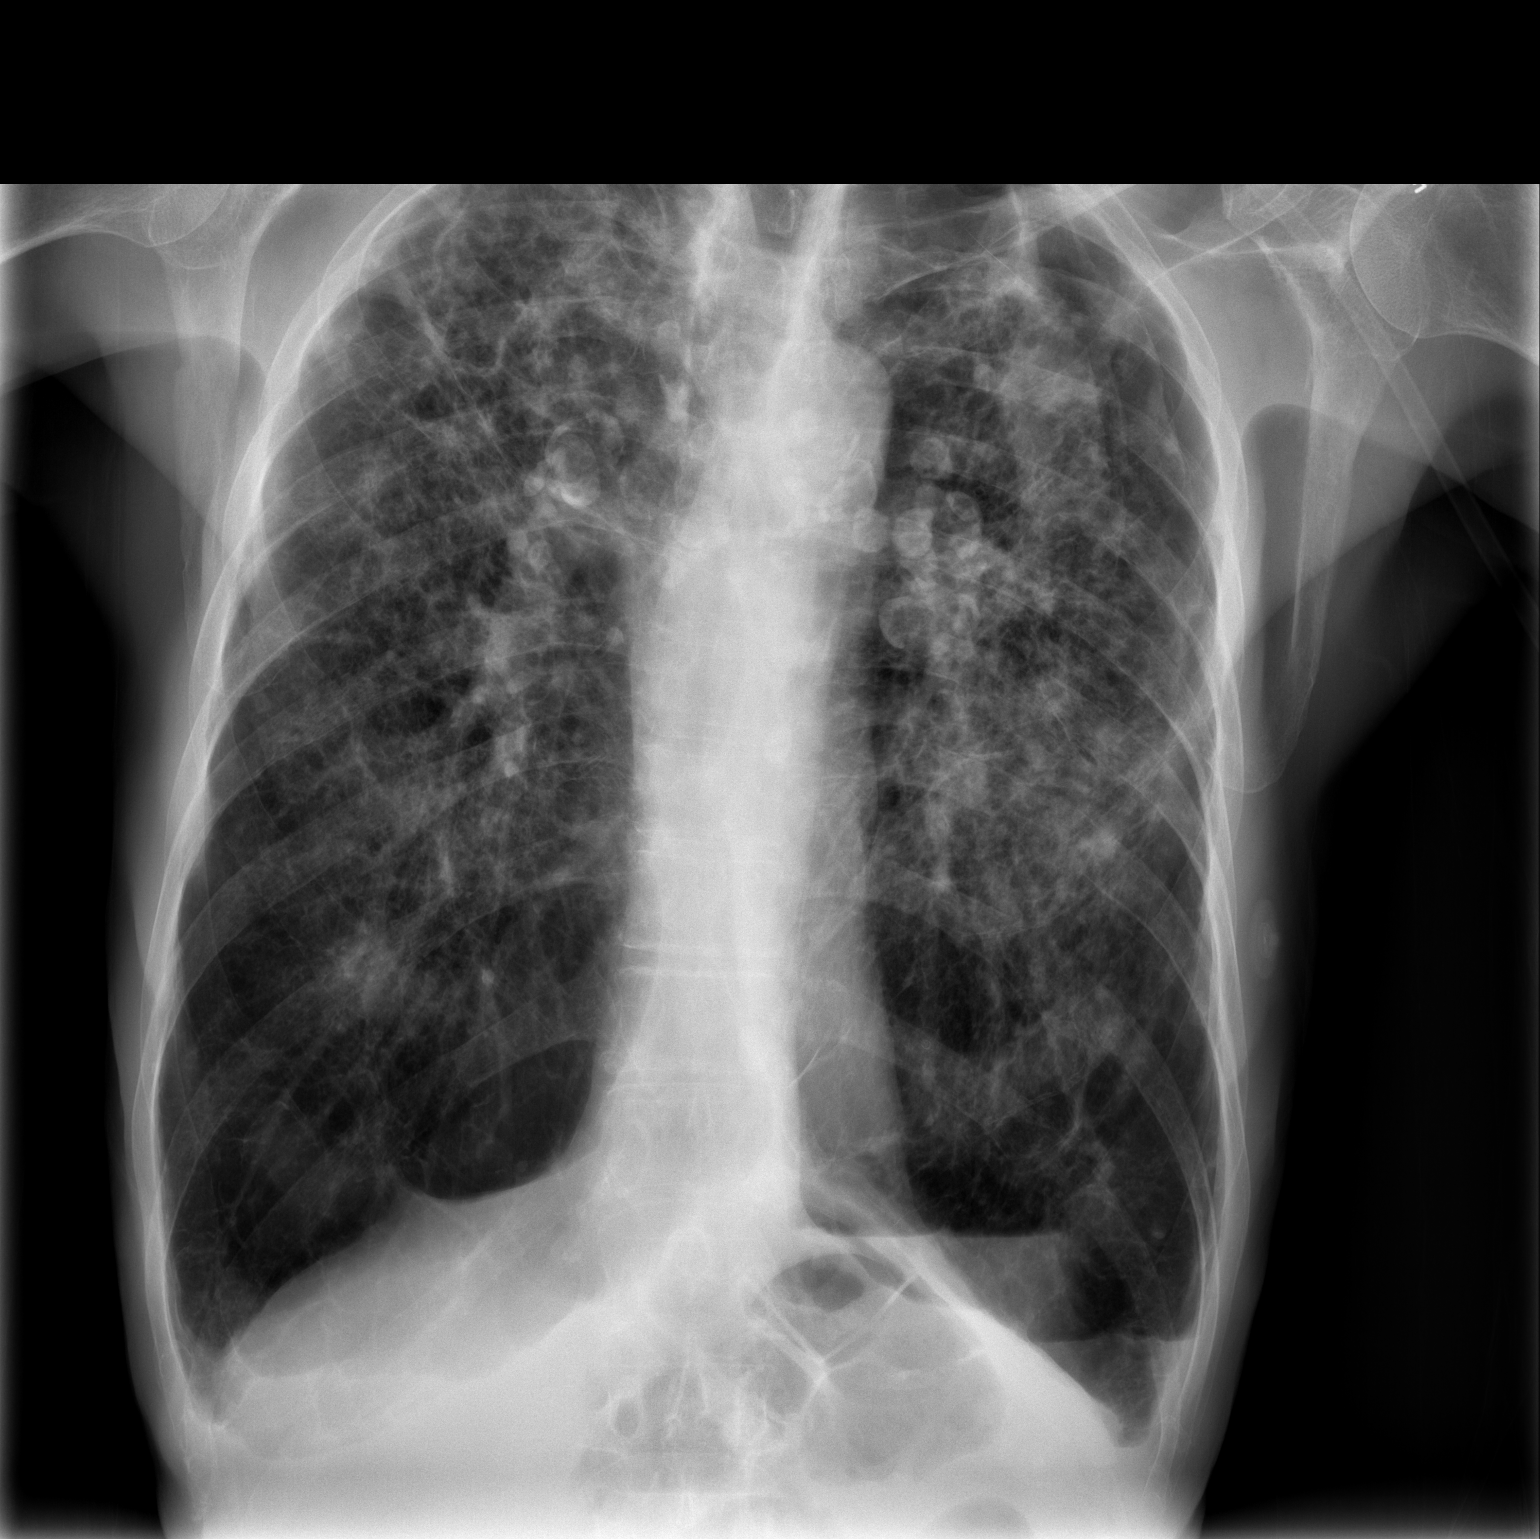

[w chest lat]
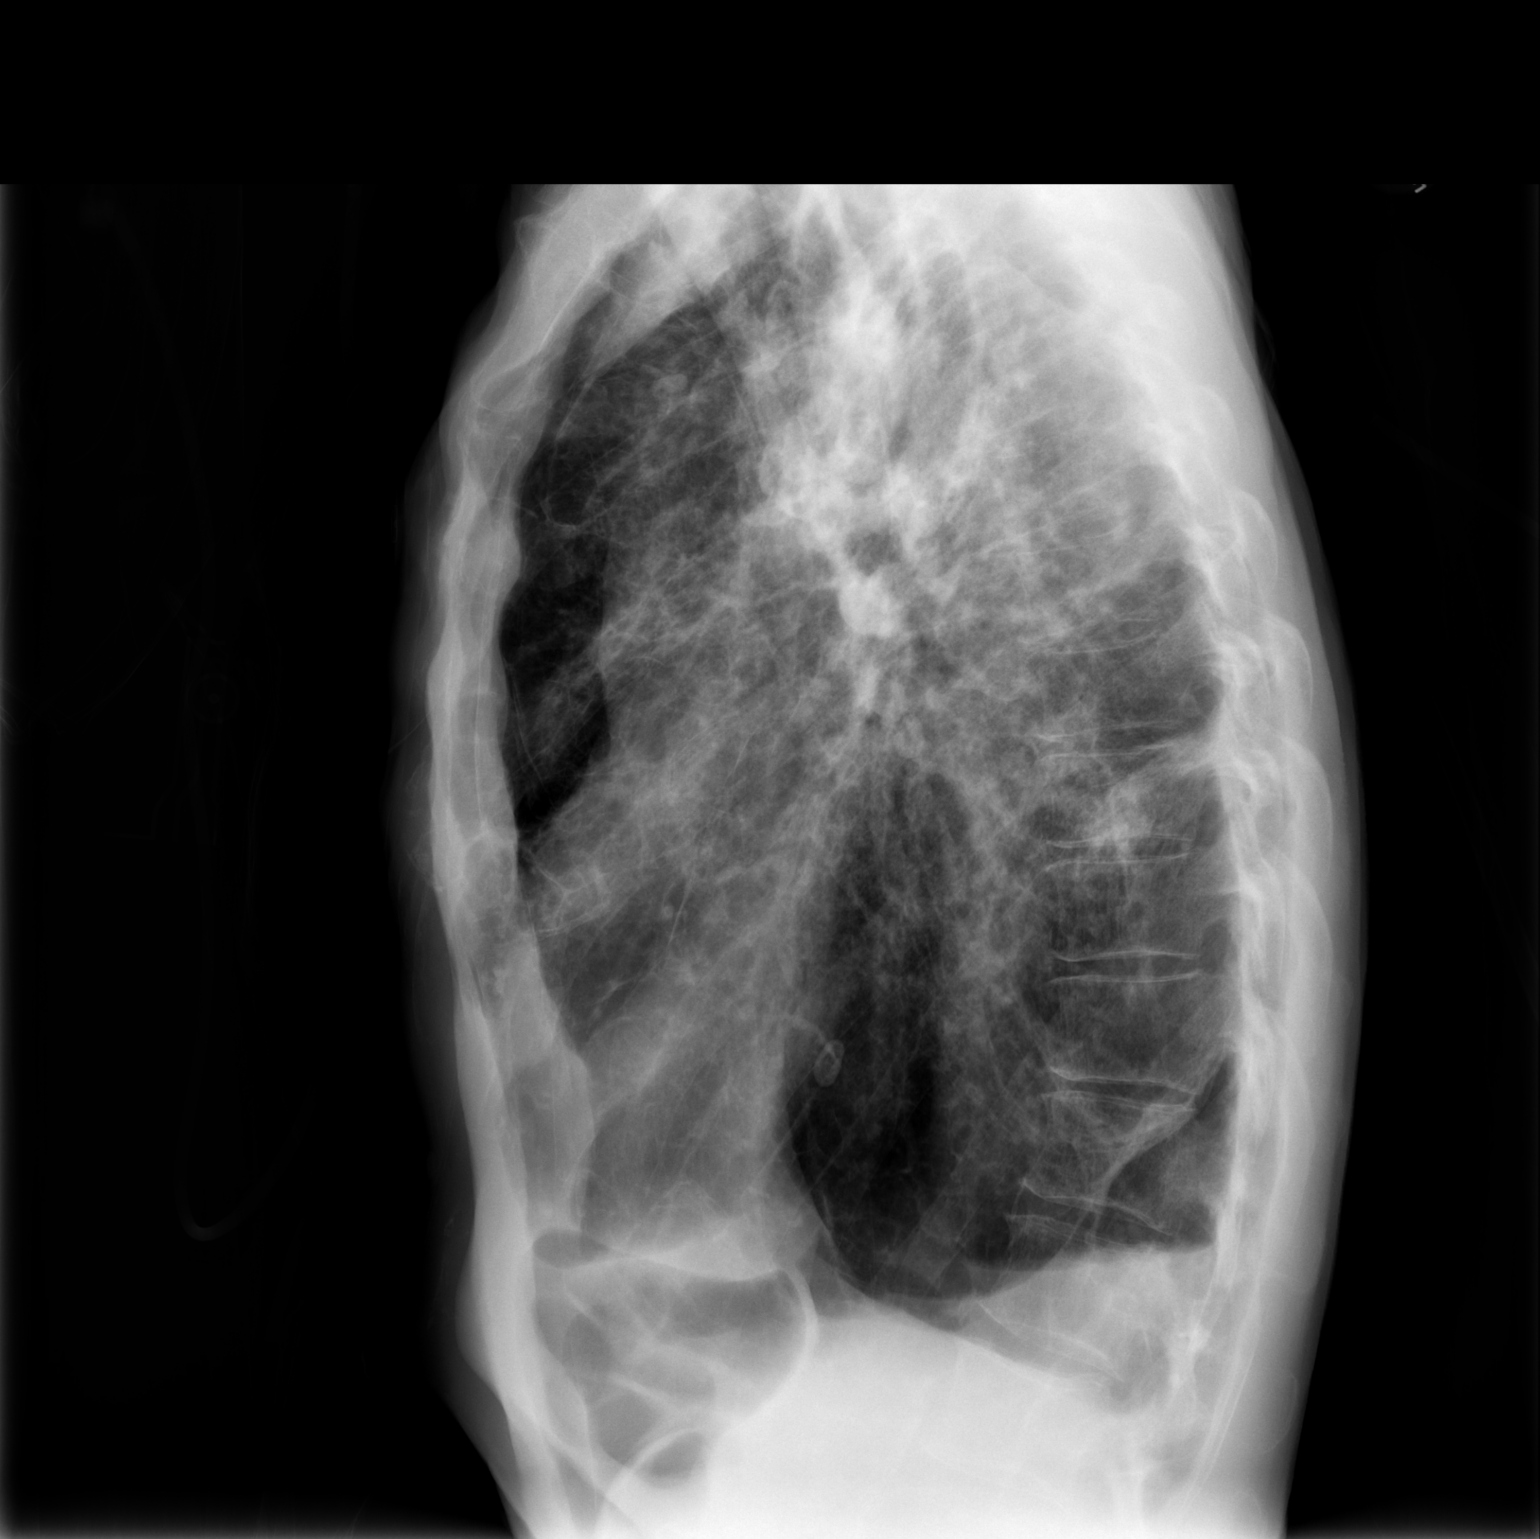

[2 of 2 positions shown; findings below may reference images not displayed]

FINDINGS: Fibrotic lung disease is stable.  Eggshell calcified
lymph nodes are stable.  Hyperaeration.  Small heart.  10% left
pneumothorax.
IMPRESSION: Stable chronic changes of fibrosis.

Left pneumothorax.

## 2012-06-04 IMAGING — CR DG CHEST 2V
3 series · 3 of 3 positions shown · non-contrast
Comparison: [DATE] and 01/30/2010

CLINICAL DATA: Recurrent spontaneous left pneumothorax.

CHEST - 2 VIEW

[w chest lat]
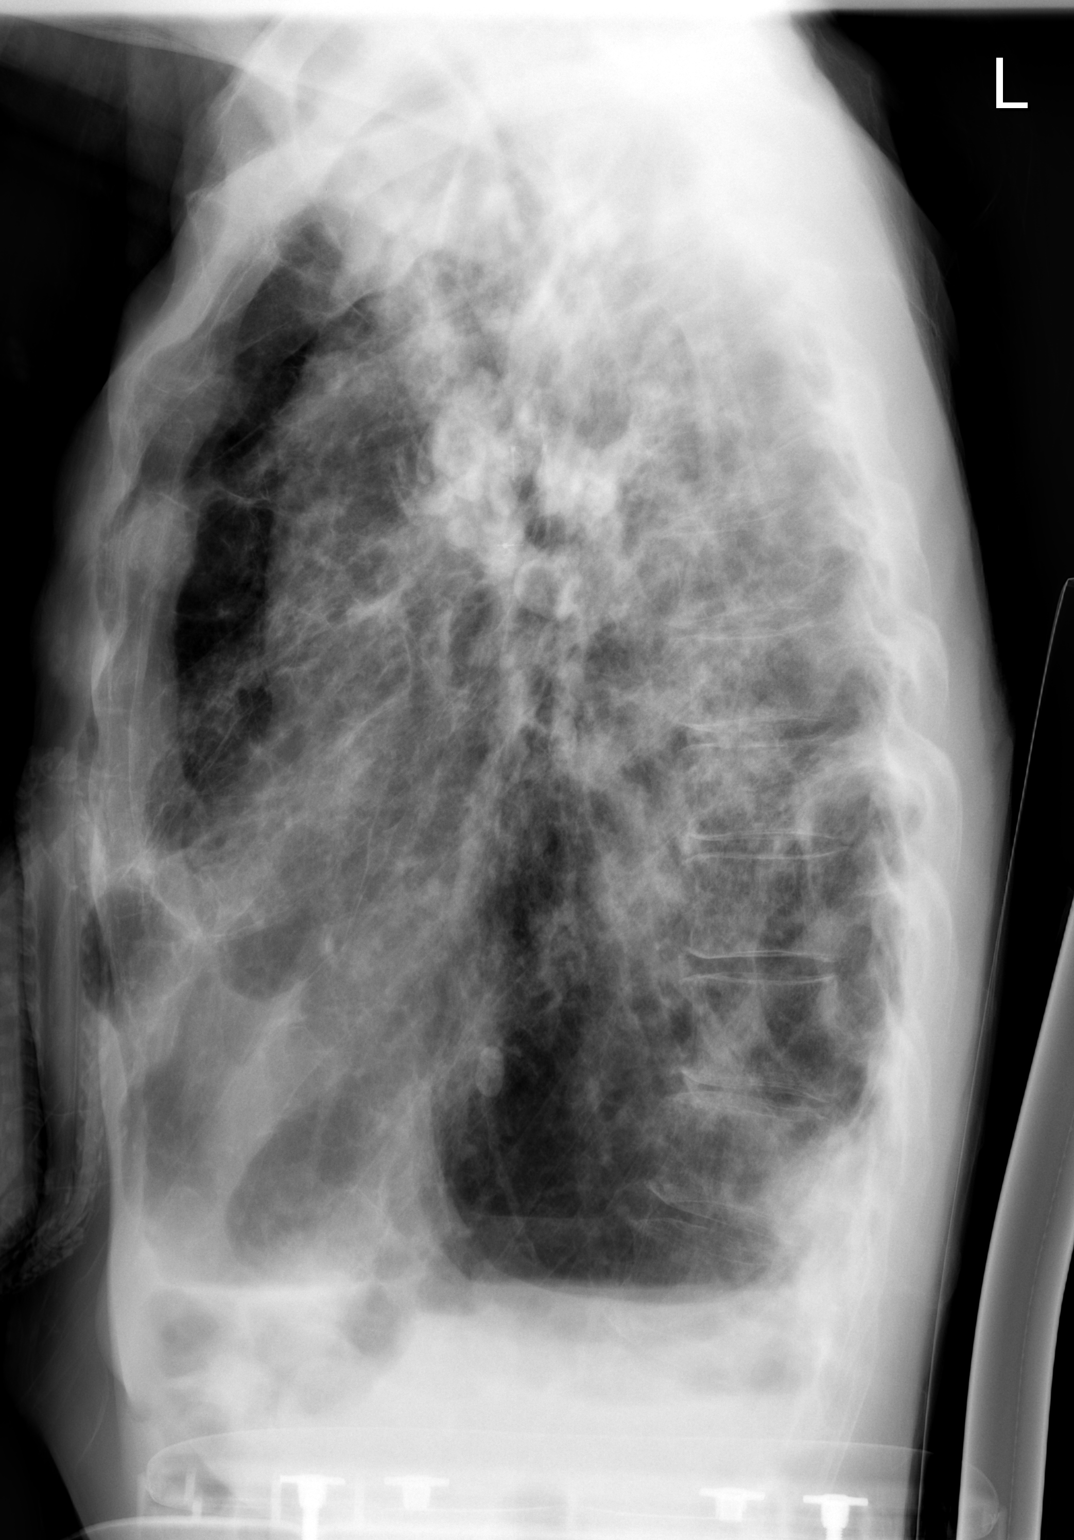

[view not recorded (1 of 2)]
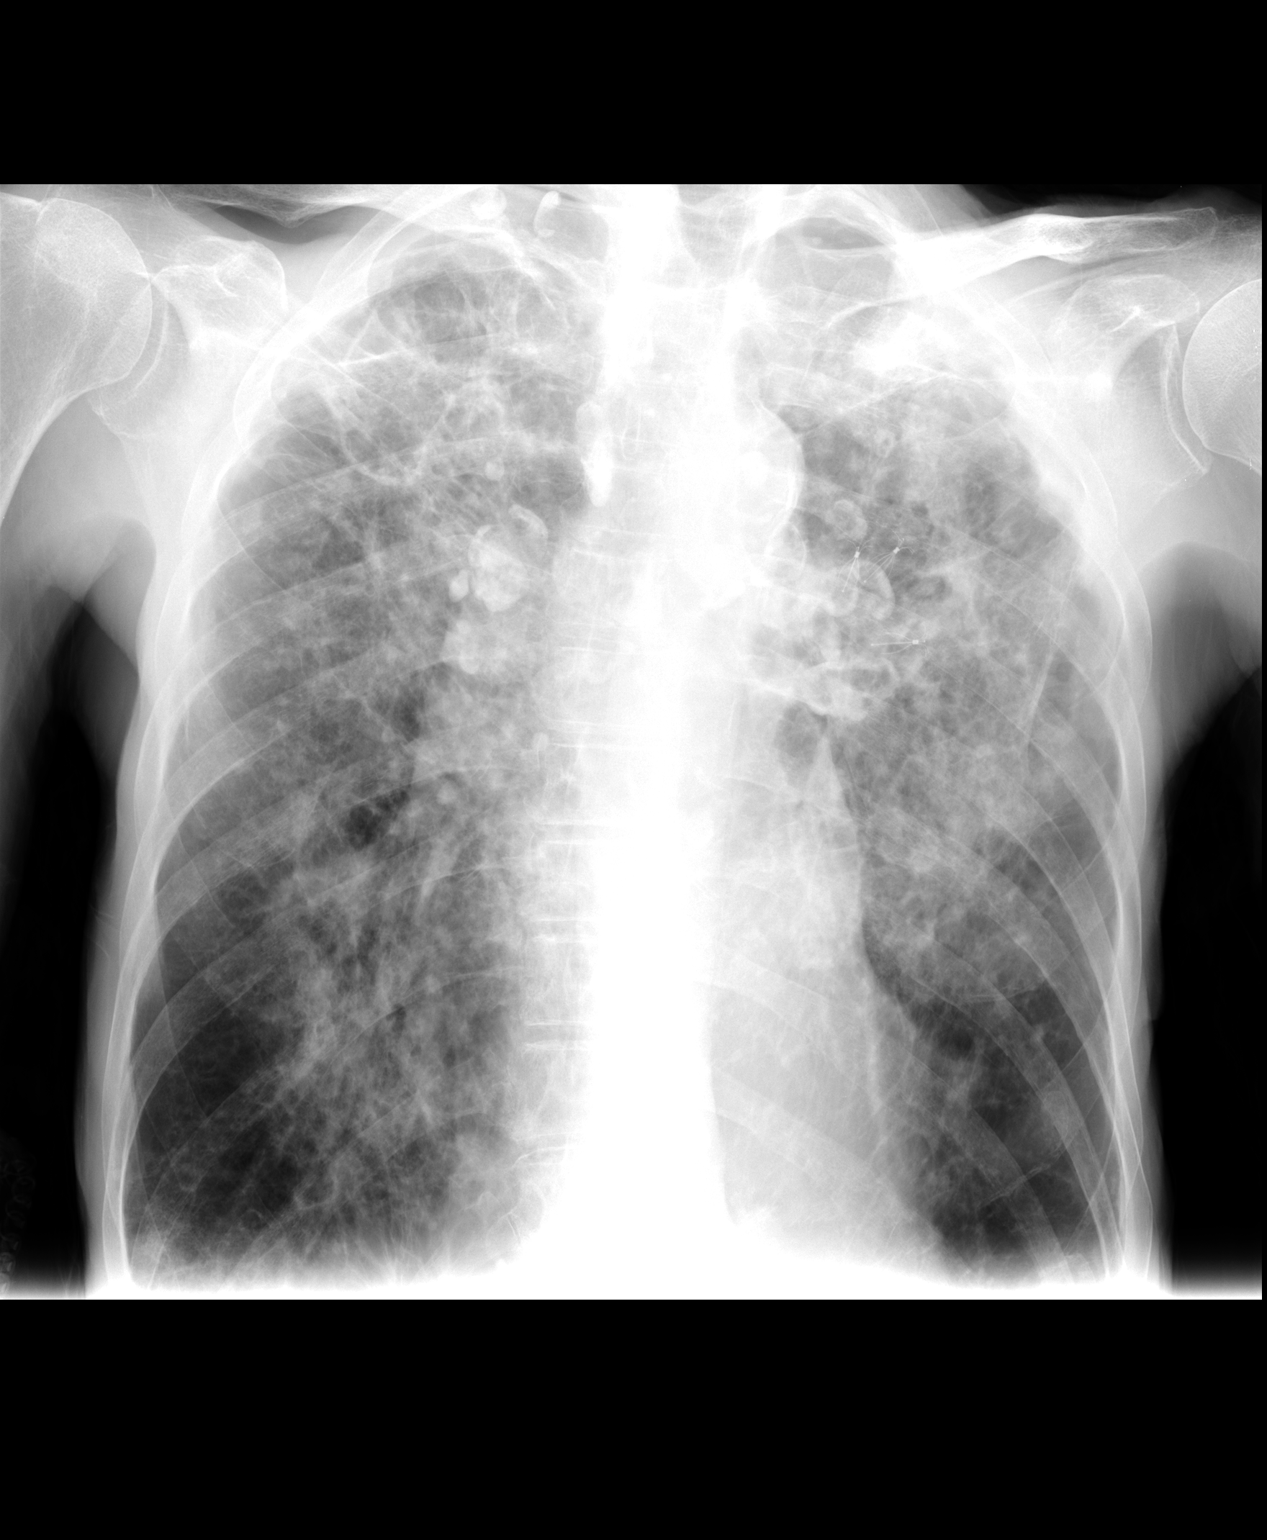

[view not recorded (2 of 2)]
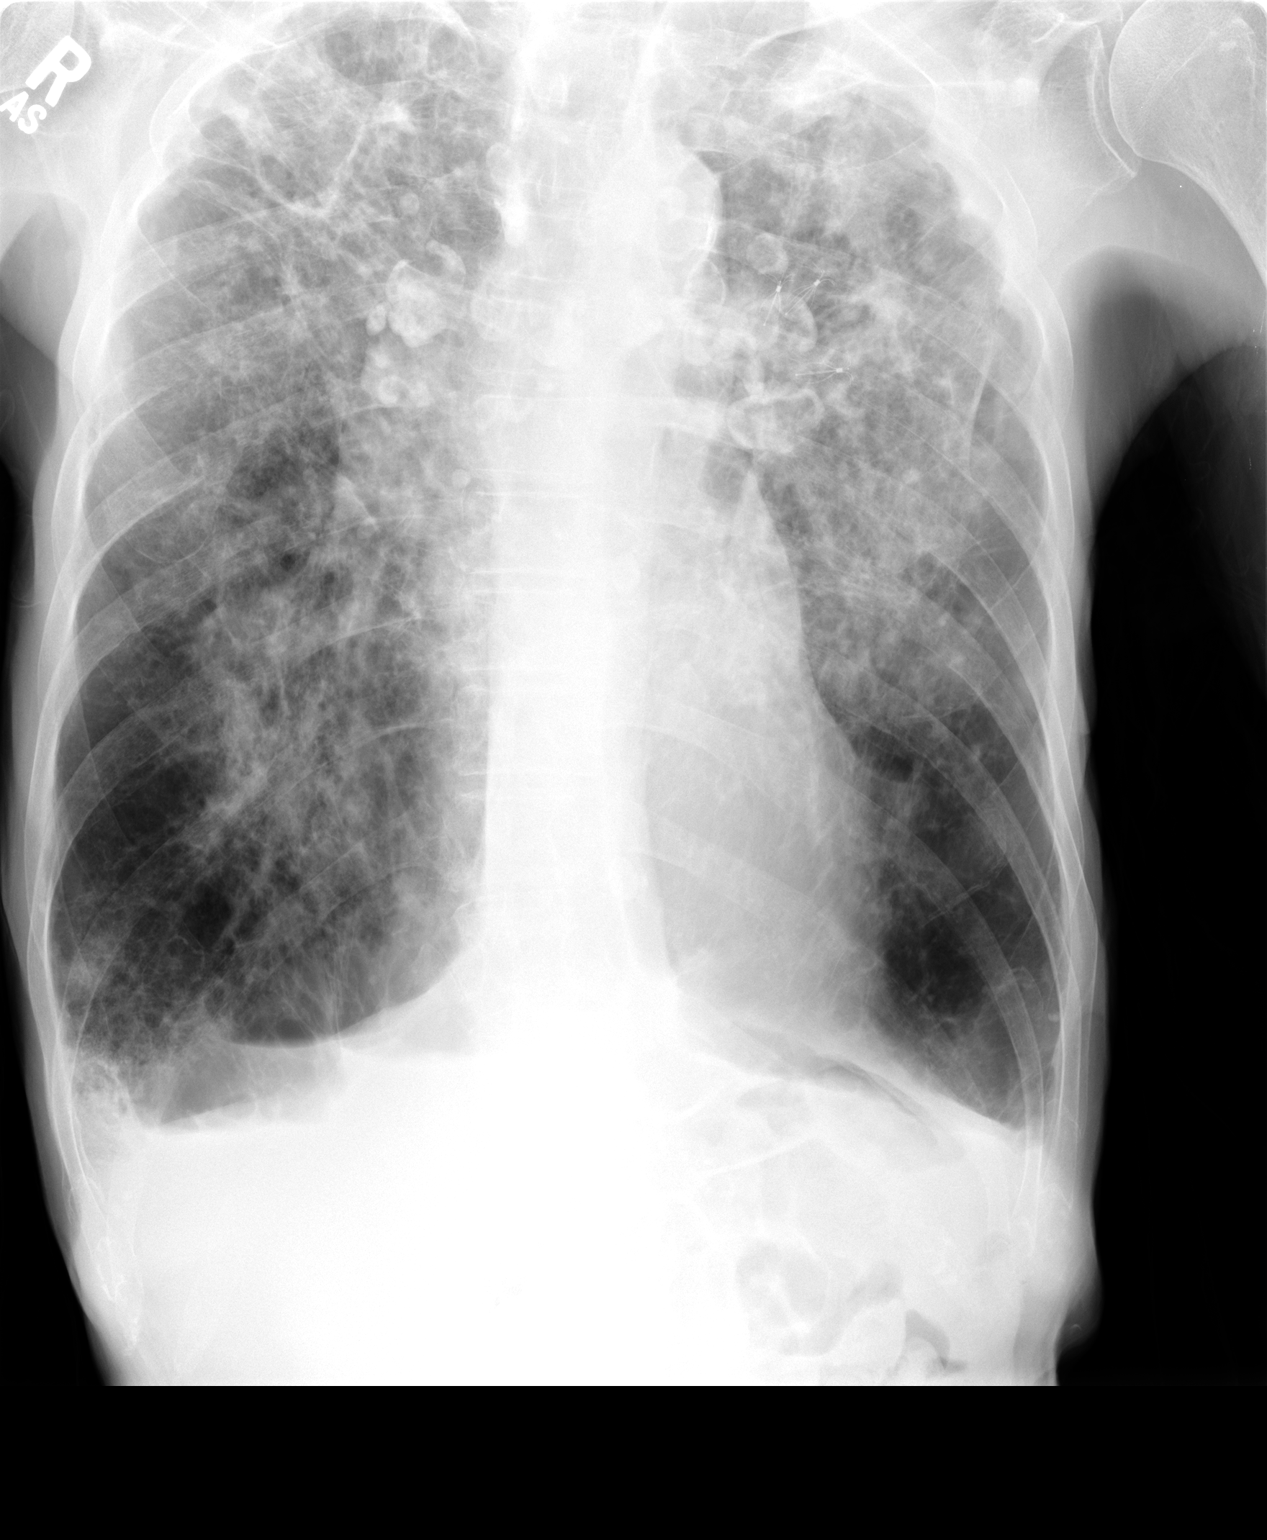

[3 of 3 positions shown; findings below may reference images not displayed]

FINDINGS: There is no residual pneumothorax.  The patient has
severe chronic interstitial and obstructive lung disease with
numerous calcified lymph nodes in the mediastinum and hilar
regions, stable.  Endobronchial valves are noted on the left,
unchanged.
IMPRESSION: 1.  Resolution of the left pneumothorax.
2.  Severe chronic interstitial and obstructive lung disease.  This
is stable.

## 2015-09-18 NOTE — Progress Notes (Signed)
This encounter was created in error - please disregard.
